# Patient Record
Sex: Male | Born: 1935 | Race: White | Hispanic: No | Marital: Married | Smoking: Former smoker
Health system: Southern US, Community
[De-identification: ages and names within clinical notes are randomized; demographics above are authoritative.]

## PROBLEM LIST (undated history)

## (undated) DIAGNOSIS — R350 Frequency of micturition: Secondary | ICD-10-CM

## (undated) DIAGNOSIS — I441 Atrioventricular block, second degree: Secondary | ICD-10-CM

## (undated) DIAGNOSIS — Z8551 Personal history of malignant neoplasm of bladder: Secondary | ICD-10-CM

## (undated) DIAGNOSIS — R300 Dysuria: Secondary | ICD-10-CM

## (undated) DIAGNOSIS — R351 Nocturia: Secondary | ICD-10-CM

## (undated) DIAGNOSIS — E785 Hyperlipidemia, unspecified: Secondary | ICD-10-CM

## (undated) HISTORY — PX: OTHER SURGICAL HISTORY: SHX169

## (undated) HISTORY — PX: LIPOMA EXCISION: SHX5283

## (undated) HISTORY — PX: CATARACT EXTRACTION: SUR2

## (undated) HISTORY — PX: TONSILLECTOMY: SUR1361

---

## 1998-05-31 ENCOUNTER — Ambulatory Visit (HOSPITAL_BASED_OUTPATIENT_CLINIC_OR_DEPARTMENT_OTHER): Admission: RE | Admit: 1998-05-31 | Discharge: 1998-05-31 | Payer: Self-pay | Admitting: Orthopedic Surgery

## 1998-08-24 ENCOUNTER — Ambulatory Visit (HOSPITAL_COMMUNITY): Admission: RE | Admit: 1998-08-24 | Discharge: 1998-08-24 | Payer: Self-pay | Admitting: Family Medicine

## 1998-10-18 ENCOUNTER — Ambulatory Visit (HOSPITAL_BASED_OUTPATIENT_CLINIC_OR_DEPARTMENT_OTHER): Admission: RE | Admit: 1998-10-18 | Discharge: 1998-10-18 | Payer: Self-pay | Admitting: Orthopedic Surgery

## 1998-12-14 ENCOUNTER — Ambulatory Visit (HOSPITAL_COMMUNITY): Admission: RE | Admit: 1998-12-14 | Discharge: 1998-12-14 | Payer: Self-pay | Admitting: Family Medicine

## 2001-10-23 ENCOUNTER — Encounter: Payer: Self-pay | Admitting: Family Medicine

## 2001-10-23 ENCOUNTER — Encounter: Admission: RE | Admit: 2001-10-23 | Discharge: 2001-10-23 | Payer: Self-pay | Admitting: Family Medicine

## 2002-11-10 ENCOUNTER — Ambulatory Visit (HOSPITAL_BASED_OUTPATIENT_CLINIC_OR_DEPARTMENT_OTHER): Admission: RE | Admit: 2002-11-10 | Discharge: 2002-11-10 | Payer: Self-pay | Admitting: Orthopedic Surgery

## 2002-11-10 ENCOUNTER — Encounter (INDEPENDENT_AMBULATORY_CARE_PROVIDER_SITE_OTHER): Payer: Self-pay

## 2002-11-10 HISTORY — PX: OTHER SURGICAL HISTORY: SHX169

## 2005-09-13 ENCOUNTER — Encounter: Admission: RE | Admit: 2005-09-13 | Discharge: 2005-09-13 | Payer: Self-pay | Admitting: Family Medicine

## 2005-10-23 ENCOUNTER — Encounter (INDEPENDENT_AMBULATORY_CARE_PROVIDER_SITE_OTHER): Payer: Self-pay | Admitting: *Deleted

## 2005-10-23 ENCOUNTER — Ambulatory Visit (HOSPITAL_BASED_OUTPATIENT_CLINIC_OR_DEPARTMENT_OTHER): Admission: RE | Admit: 2005-10-23 | Discharge: 2005-10-23 | Payer: Self-pay | Admitting: Orthopedic Surgery

## 2005-10-23 HISTORY — PX: DUPUYTREN CONTRACTURE RELEASE: SHX1478

## 2008-01-13 ENCOUNTER — Ambulatory Visit: Payer: Self-pay | Admitting: Internal Medicine

## 2008-01-27 ENCOUNTER — Encounter: Payer: Self-pay | Admitting: Internal Medicine

## 2008-01-27 ENCOUNTER — Ambulatory Visit: Payer: Self-pay | Admitting: Internal Medicine

## 2009-03-09 HISTORY — PX: TRANSTHORACIC ECHOCARDIOGRAM: SHX275

## 2009-04-19 ENCOUNTER — Ambulatory Visit: Payer: Self-pay | Admitting: Family Medicine

## 2009-12-12 ENCOUNTER — Ambulatory Visit (HOSPITAL_BASED_OUTPATIENT_CLINIC_OR_DEPARTMENT_OTHER): Admission: RE | Admit: 2009-12-12 | Discharge: 2009-12-12 | Payer: Self-pay | Admitting: Urology

## 2010-02-03 ENCOUNTER — Ambulatory Visit (HOSPITAL_BASED_OUTPATIENT_CLINIC_OR_DEPARTMENT_OTHER): Admission: RE | Admit: 2010-02-03 | Discharge: 2010-02-03 | Payer: Self-pay | Admitting: Urology

## 2010-02-03 HISTORY — PX: TRANSURETHRAL RESECTION OF BLADDER TUMOR: SHX2575

## 2010-06-05 ENCOUNTER — Ambulatory Visit (HOSPITAL_BASED_OUTPATIENT_CLINIC_OR_DEPARTMENT_OTHER): Admission: RE | Admit: 2010-06-05 | Discharge: 2010-06-05 | Payer: Self-pay | Admitting: Urology

## 2010-12-22 LAB — POCT HEMOGLOBIN-HEMACUE: Hemoglobin: 15.5 g/dL (ref 13.0–17.0)

## 2010-12-26 LAB — POCT HEMOGLOBIN-HEMACUE: Hemoglobin: 16.5 g/dL (ref 13.0–17.0)

## 2011-01-01 LAB — POCT HEMOGLOBIN-HEMACUE: Hemoglobin: 14 g/dL (ref 13.0–17.0)

## 2011-01-07 HISTORY — PX: CORRECTION HAMMER TOE: SUR317

## 2011-02-23 NOTE — Op Note (Signed)
NAMECLOIS, TREANOR              ACCOUNT NO.:  [id number]   MEDICAL RECORD NO.:  [id number]          PATIENT TYPE:  AMB   LOCATION:  DSC                          FACILITY:  [facility]   PHYSICIAN:  Katy Fitch. Sypher, M.D. DATE OF BIRTH:  09/03/1934   DATE OF PROCEDURE:  10/23/2005  DATE OF DISCHARGE:                                 OPERATIVE REPORT   PREOPERATIVE DIAGNOSIS:  Complex recurrent Dupuytren's contracture of right  small finger and palm, status post prior resection of Dupuytren's  contracture from right hand in January 2000, status post second attempt at  correction of finger contracture by needle aponeurotomy performed by Dr.  Agapito Games in [place], [place], in 2004, with a choice by Dr. Roderic Scarce to  abort the procedure due to technical challenges in air recurrent Dupuytren's  contracture and severe Dupuytren's diathesis.   POSTOPERATIVE DIAGNOSIS:  Complex recurrent Dupuytren's contracture with  identification of absent radial proper digital artery and profound palmar  fibromatosis, recurrent, extending from the level of the A1 pulley and the  palm distally to the A5 pulley, with unusual and complex nodules of palmar  fibromatosis surrounding the neurovascular bundles at the level of the A2  and A4 pulleys and a large nodule centrally overlying the A2, C1 and A3  pulleys.   OPERATION:  Extremely complex resection of recurrent Dupuytren's  contracture, right small finger.   OPERATING SURGEON:  Katy Fitch. Sypher, M.D.   ASSISTANT:  Molly Maduro Dasnoit PA-C.   ANESTHESIA:  General by LMA, supervising anesthesiologist Dr. Gelene Mink.   INDICATIONS:  Sair Faulcon is a 77 year old gentleman referred by Dr.  Bradd Canary for evaluation of Dupuytren's contracture initially in 1999.   At that time he was noted have bilateral Dupuytren's disease and ultimately  underwent a resection of his palmar fibromatosis from his left hand in  August 1999.  He subsequently had resection of  disease from his right palm,  ring finger and small finger in January 2000.  Within 3 years, he had  recurrent disease over the PIP flexion crease region and proximal phalangeal  segment of his right small finger and after a lengthy Internet search  elected to seek a consult with Dr. Christophe Louis, a hand surgeon in [place],  [place], who has extensive experience with needle aponeurotomy, i.e., a  percutaneous procedure to relieve palmar fibromatosis contractures.   Mr. Matherne traveled to see Dr. Roderic Scarce.  I prepared Mr. Hartis with a list  of written questions to ask Dr. Roderic Scarce about the risks and benefits of needle  aponeurotomy after a prior procedure with the complex scars that typically  form following prior open surgery.   Dr. Roderic Scarce attempted a percutaneous release of Mr. Neville's cords.  He  aborted his procedure after improving the flexion contracture of the PIP  joint somewhat.  I never was able to obtain a procedure note from Dr. Roderic Scarce;  however, he did call me on the phone and explained his experience with Mr.  Curnutt's contracture.   I shared with Dr. Roderic Scarce my skepticism that it would be safe or effective to  try a  needle aponeurotomy after prior open surgery.   Mr. Tardif returned in 2006 with severe recurrent disease in his right  small finger with an 85-degree PIP flexion contracture and extensive nodular  disease.   We had a lengthy informed consent, during which I explained that with his  diathesis and past experience with severe recurrent disease, an open  procedure could be attempted.  However, given the two prior procedures and  our difficulties with a redo of his left small finger in the past, he fully  understood that there were extreme risks involved in this procedure.  Indeed, I have no way of knowing on the preoperative evaluation whether or  not he has impairment of his vascular structures after his needle  aponeurotomy.   He explained that he was  contemplating a removal of his finger but was  willing to try a recurrent open procedure in an effort to try to relieve his  85-degree flexion contracture of the small finger.   He understands that he is at significant risk to develop a nerve or vascular  injury with reexploration surgery and could lose his finger.  After informed  consent, he is brought to the operating room at this time.  Preoperatively  questions were invited and answered, both in the office and in the holding  area.   PROCEDURE:  Aylan Bayona was brought to the operating room and placed in  supine position upon the table.  Following an anesthesia consult by Dr.  Gelene Mink, general anesthesia by LMA was selected.   Following induction of general anesthesia by LMA technique, the right arm  was prepped with Betadine soap and solution and sterilely draped.  A  pneumatic tourniquet was applied to the proximal brachium.   Following administration of 1 g of Ancef as an IV prophylactic antibiotic,  the arm was exsanguinated with an Esmarch bandage and the arterial  tourniquet initially inflated to 230 mmHg.   Due to some congestion of the hand after two minutes, we rewrapped a second  time and inflated the tourniquet to 230 mmHg.   Brunner zigzag incisions were planned from the level of the A5 pulley  distally to the A1 pulley proximally.  The skin incisions were taken sharply  with meticulous elevation of the dermis off of the pathologic fascia.  Mr.  Brisendine had profound fibromatosis extending the length of the incision with  a very atypical architecture.  In the past re-exploration of his left small  finger was extremely arduous and required reconstruction of the radial  proper digital artery.  I identified his all radial and ulnar proper digital  arteries at the level of the bifurcation of the common digital artery and the radial aspect of the finger and adjacent to the hypothenar muscles in  the distal palm.   These were dissected distally.  At the level of the  proximal margin of the A2 pulley, the radial proper digital artery was  absent distally.  The radial and ulnar proper digital nerves were identified  and carefully protected.   One and one-half hours of extremely arduous dissection around the proper  digital nerves was required to remove the nodular disease.  The typical  landmarks we find in Dupuytren's disease were absent given his recurrence  and probably aggravated by his prior percutaneous procedure with hematoma  formation, etc.   Ultimately we were able to remove the majority of the pathologic fascia and  relieve his PIP flexion contracture to less than 5 degrees.  After the radial proper digital artery predicament was identified, I  released the tourniquet.  There was no bleeding from arterial structures on  either the radial or ulnar side of the finger.  The ulnar proper digital  artery was identifiable and was carefully protected throughout the  procedure.  I could not find any distal portion of the radial proper digital  artery.   With the tourniquet release during the procedure, it was clear that there  was adequate collateral circulation to the ulnar proper digital artery and  perhaps some dorsal vessels to allow a viable finger.  Therefore, we  absolutely meticulously protected the ulnar proper digital artery for the  remainder of the procedure.   After excision of the pathologic fascia, I concluded that no further  procedures to this finger should ever be contemplated given the issues with  the radial proper digital artery.  There was no way to reconstruct distally.  The capillary refill in the pulp and turgor of the pulp was normal with the  flow through the ulnar proper digital artery.   After hemostasis was achieved with bipolar cautery under saline and direct  pressure, the wound was repaired with mattress sutures of 5-0 nylon.  The  skin overlying the finger  was lengthened by V-Y advancement flaps at the  apices of the wound.   The finger was then dressed with Silvadene, Adaptic, sterile gauze, sterile  Kerlix, sterile Webril and a volar plaster splint maintaining the small  finger in near-full extension of the MP joint, a 10-degree flexion position  at the PIP joint and a 10-degree flexion position at the DIP joint.   Capillary refill in the pulp was preserved during splinting.   There were no apparent complications during this procedure other than it was  extraordinarily difficult.   This was the first procedure I had ever accomplished after a prior  percutaneous fasciotomy.  My experience during this procedure will give me  great pause to ever attempt this again given the extreme changes in the  architecture of the fibromatosis and its relationship to the neurovascular  bundles.  Mr. Goldston awakened from his general anesthesia and transferred to the  recovery room with stable signs.   He will be discharged home to the care was wife with prescriptions for  Percocet 5 mg one p.o. q.4-6h. p.r.n. pain, Motrin 600 mg one p.o. q.6h. as  an analgesic, and Keflex 500 mg one p.o. q.8h. x4 days as a prophylactic  antibiotic.      Katy Fitch Sypher, M.D.  Electronically Signed     RVS/MEDQ  D:  10/23/2005  T:  10/23/2005  Job:  [id number]

## 2011-02-23 NOTE — Op Note (Signed)
James Dorsey, James Dorsey                        ACCOUNT NO.:  [id number]   MEDICAL RECORD NO.:  [id number]                   PATIENT TYPE:  AMB   LOCATION:  DSC                                  FACILITY:  [facility]   PHYSICIAN:  Katy Fitch. Naaman Plummer., M.D.          DATE OF BIRTH:  1934/03/09   DATE OF PROCEDURE:  11/10/2002  DATE OF DISCHARGE:                                 OPERATIVE REPORT   PREOPERATIVE DIAGNOSIS:  Recurrent and persistent Dupuytren's contracture  left hand with extensive involvement of thumb, index, ring finger, and  significant recurrence of PIP flexion contracture and MP nodules left small  finger.   POSTOPERATIVE DIAGNOSIS:  Recurrent and persistent Dupuytren's contracture  left hand with extensive involvement of thumb, index, ring finger, and  significant recurrence of PIP flexion contracture and MP nodules left small  finger.   PROCEDURE:  1. Resection of recurrent Dupuytren's contracture from palm and left small     finger and ring/small finger web space with multiple Z-plasties to gain     skin length and resect previous hypertrophic scar.  2. 1 cm reversed vein graft harvested from volar aspect of wrist, placed to     reconstruct to the radial proper digital artery just beyond the     bifurcation for the ring and small fingers, left hand.   SURGEON:  Katy Fitch. Sypher, M.D.   ASSISTANT:  Jonni Sanger, P.A.   ANESTHESIA:  Axillary block followed by LMA, supervising anesthesiologist is  Janetta Hora. Gelene Mink, M.D.   INDICATIONS FOR PROCEDURE:  The patient is a 77 year old man with extensive  Dupuytren's diathesis involving his hands bilaterally.  Approximately three  years ago, he underwent excision of a palmar contracture and a contracture  from the left small finger.  His initial result was excellent.  However,  approximately 18 months following surgery, he developed recurrent nodular  disease followed by the onset of a 60 degree flexion  contracture of his  small finger PIP joint.  He returned for a follow-up hand surgery  consultation.  On examination, he was noted to have pips at the MP joint of  his thumb with a significant thumb/index web space natatory ligament  contracture and PIP disease involving the index, long, and ring fingers as  well as the recurrent disease in the small finger.  He requested repeat  surgical excision.  In the office during our consultation, we had a lengthy  discussion regarding the increased risks with revision surgery,  specifically, the distinct possibility that neurovascular injury can occur  while essentially freeing neurovascular structures from dense scar.  He also  understands that there are potential risks of skin necrosis, infection, and  postoperative stiffness/regional pain syndrome.  After informed consent, he  is brought to the operating room at this time anticipating recurrent  Dupuytren's surgery.   DESCRIPTION OF PROCEDURE:  The patient was brought to the operating room and  placed in the supine position on the operating table.  Following axillary  block in the holding area, anesthesia was inconsistent in the left arm,  therefore general anesthesia by LMA was induced.   The left arm was prepped with Betadine soap and solution and sterilely  draped.  A pneumatic tourniquet was placed on the proximal brachium.  1 gram  of Ancef was administered as an IV prophylactic antibiotic followed by  exsanguination of the left arm with an Esmarch bandage and inflation of the  arterial tourniquet to 230 mmHg.   The procedure commenced with planning of a midline incision with anticipated  Z-plasties in an effort to minimize potential trauma to the neurovascular  structures.  At the initial dissection, it was obvious that this was going  to be an extremely challenging procedure.   There was dense scarring from the dermis down to the level of the flexor  sheath, the length of A1 to A3  and a large central cord extending beyond A3  toward the A4 pulley.  The flexion contracture of the PIP joint was complex  involving lateral fascial sheath nodules, a pair of spiral bands, and a  central band as well.   The wound was extended proximally to the level of the distal palmar crease  to allow identification of the neurovascular structures in [place].   Distal dissection along the radial proper digital nerve and artery was  relatively straightforward and ultimately the disease was removed from the  radial aspect of the finger without significant difficulty.   Upon following the ulnar proper digital artery and nerve from the  bifurcation with the common proper digital artery and nerve, I encountered  extremely severe scarring in the region of the former natatory ligaments to  the ring finger.  While spreading tissue, we noted a blush of blood and with  the aid of loop magnification, I was able to see a longitudinal tear along  the radial proper digital artery approximately 5 mm distal to the  bifurcation. This was marked with a microvascular clip and the dissection  continued.  Thereafter the operating microscope was used for about 1-1/2  hours of dissection.   The Dupuytren's disease involved virtually all structures between the dermis  and Cleland's ligaments dorsally.  With extraordinary care the distal  portion of the radial proper digital artery and nerve were dissected out  followed by removal of all pathologic fascia.  This corrected the PIP  flexion contracture completely.   The skin was very firm due to involvement of the skin on the palmar surface  of the finger.  Rather than grafting, I elected to perform multiple Z-  plasties with a total of four Z-plasties between the PIP flexion crease and  the middle palmar crease.   45 degree Z-plasties were planned and turned in the usual manner.  The  scarred skin made this somewhat challenging, but ultimately we  were able to increase the length of the wound and relieve the PIP and MP flexion  contractures.   The tourniquet was released and there was noted to be bleeding from the  lumen of the radial proper digital artery. There was steeling along the  ulnar aspect of the finger, but I was not satisfied with the turgor of the  finger.  Therefore, I elected to proceed with a reversed vein graft.   The tourniquet was released for 20 minutes.  Thereafter the arm was re  exsanguinated and with the aid of  the operating microscope, a 1.5 cm volar 1  mm vein graft was harvested adjacent to the flexor carpi radialis. This was  marked with a 4-0 Vicryl suture to maintain orientation.  The proximal and  distal segments of the vein were suture ligated with 4-0 Vicryl.  This wound  was then repaired with intradermal 3-0 Prolene and Steri-Strips.  Attention  was then directed to the finger.  With the aid of the operating microscope,  utilizing backwall first technique and Tsai solution, a 1 cm segment of the  radial proper digital artery was replaced.  The microsurgical procedure was  notable for some plaque within the distal segment of the radial proper  digital artery which is consistent with age 77 and peripheral  atherosclerosis.   An excellent intimal repair was achieved with 10-0 nylon proximally and  distally.  The clamps were released with immediate flow through the lumen.   The tourniquet was released followed by brisk capillary refill to all  fingers and return of turgor to all fingers including the small finger.   The primary indication for vein grafting was viability of the finger to be  certain after extensive dissection as well as prevention of cold  intolerance.   In summary, this is a very complicated procedure.  This should be coded as a  -22 procedure due to the extreme complexity of the recurrent Dupuytren's.  Given the preoperative discussion of possible neurovascular injury and the   anticipation of possible reconstruction, this is also coded as an  anticipated vein graft potentially.   The wounds were subsequently dressed with Silvadene, Adaptic, fluff gauze,  Curlex, Acrylic fluff, and a volar plaster splint maintaining the wrist in  neutral and the fingers extended at the MP joints.  There were no other  complications other than the radial proper digital artery tear.                                                Katy Fitch Naaman Plummer., M.D.    RVS/MEDQ  D:  11/10/2002  T:  11/10/2002  Job:  [id number]

## 2012-12-03 ENCOUNTER — Other Ambulatory Visit: Payer: Self-pay | Admitting: Urology

## 2012-12-03 ENCOUNTER — Encounter (HOSPITAL_BASED_OUTPATIENT_CLINIC_OR_DEPARTMENT_OTHER): Payer: Self-pay | Admitting: *Deleted

## 2012-12-03 NOTE — Progress Notes (Signed)
NPO AFTER MN. ARRIVES AT 1000. NEEDS HG AND EKG.

## 2012-12-05 NOTE — H&P (Signed)
History of Present Illness        Bladder cancer - He had an episode of gross, painless hematuria and underwent evaluation with a CT scan on 11/18/09 that revealed no abnormality of the upper tract or worrisome adenopathy in the pelvis. 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    Because there was no involvement of the muscularis on the initial resection I told him that in order to consider any bladder preservation protocols I would need to perform a repeat biopsy/re-resection of the area which was performed on 02/03/10. 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    He underwent a 6 week course of full dose BCG and then on 06/04/10 had a repeat bladder biopsy which revealed granulomatous inflammation but no evidence of dysplasia or malignancy with muscle present on the biopsy. He was then treated with maintenance BCG beginning in 12/11 with a planned duration of 36 months.        Elevated PSA:    5/10 - 2.83    9/11 - 6.33    12/11 - 4.45  His PSA subsequently fell back to within the normal range consistent with inflammation likely secondary to the effects of BCG.    Frequency/urgency/nocturia (LUTS): He has some trabeculation of the bladder seen cystoscopically and is having symptoms of detrusor instability. He has no evidence of irritative focus within the bladder so I'm going to initiate a trial of anticholinergic therapy to see if this helps. He tried VESIcare and Enablex and found that the VESIcare seemed a little bit more effective but wanted to try the VESIcare at a higher dosage.  Interval history: He has been having dysuria for about 6 weeks now. He was seen for this and a urine culture was performed and found to be  negative. He's not having any hematuria. He denies any new irritative symptoms. He's not seen any hematuria. IPSS 12/03/12 - 18   Past Medical History Problems  1. History of  Dupuytren's Contracture 728.6 2. 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 History of  Cystoscopy With Biopsy 2. History of  Cystoscopy With Biopsy 3. History of  Cystoscopy With Biopsy 4. History of  Cystoscopy With Fulguration Small Lesion (5-34mm) 5. History of  Open Palmar Fasciotomy For Dupuytren's Contracture 6. History of  Open Palmar Fasciotomy For Dupuytren's Contracture  Current Meds 1. Aspirin TABS; Therapy: (Recorded:10Feb2011) to 2. Flax Seed Oil CAPS; Therapy: (Recorded:14Aug2013) to 3. Garlic TABS; Therapy: (Recorded:06Sep2011) to 4. Oncovite Oral Tablet; Therapy: (Recorded:28Mar2012) to 5. Simvastatin TABS; Therapy: (Recorded:10Feb2011) to 6. Uribel 118 MG Oral Capsule; TAKE 1 CAPSULE Twice daily PRN; Therapy: 16Jan2014 to (Last  Rx:16Jan2014)  Allergies Medication  1. No Known Drug Allergies  Family History Problems  1. Family history of  No Significant Family History  Social History Problems  1. Alcohol Use occassional 2. Caffeine Use 2 cups of coffee a day 3. Former Smoker V15.82 quit 45 years ago. 1 ppd for 25 years 4. Marital History - Currently Married 5. Occupation: retired Denied  6. History of  Drug Use 7. History of  Tobacco Use   Review of Systems Genitourinary, constitutional, skin, eye, otolaryngeal, hematologic/lymphatic, cardiovascular, pulmonary,  endocrine, musculoskeletal, gastrointestinal, neurological and psychiatric system(s) were reviewed and pertinent findings if present are noted.  Genitourinary: urinary frequency, feelings of urinary urgency, nocturia and post-void dribbling.    Vitals Vital Signs Blood Pressure  Blood Pressure: 136 / 73 Pulse  Heart Rate: 70 Respiration  Respiration:  12  Physical Exam Constitutional: Well nourished and well developed. No acute distress.  ENT:. The ears and nose are normal in appearance.  Neck: The appearance of the neck is normal and no neck mass is present.  Pulmonary: No respiratory distress and normal respiratory rhythm and effort.  Cardiovascular: Heart rate and rhythm are normal. No peripheral edema.  Abdomen: The abdomen is soft and nontender. No masses are palpated. No CVA tenderness. No hernias are palpable. No hepatosplenomegaly noted.  Rectal: Rectal exam demonstrates normal sphincter tone, no tenderness and no masses. Estimated prostate size is 1+. The prostate has no nodularity and is not tender. The left seminal vesicle is nonpalpable. The right seminal vesicle is nonpalpable. The perineum is normal on inspection.  Genitourinary: Examination of the penis demonstrates no discharge, no masses, no lesions and a normal meatus. The penis is circumcised. The scrotum is without lesions. The right epididymis is palpably normal and non-tender. The left epididymis is palpably normal and non-tender. The right testis is non-tender and without masses. The left testis is non-tender and without masses.  Lymphatics: The femoral and inguinal nodes are not enlarged or tender.  Skin: Normal skin turgor, no visible rash and no visible skin lesions.  Neuro/Psych:. Mood and affect are appropriate.   The following images/tracing/specimen were independently visualized:  Ultrasound PVR as below.  The following clinical lab reports were reviewed:  He did have red cells noted in his urine today.  PVR: Ultrasound PVR 27 ml.    Procedure  Procedure: Cystoscopy   Indication: History of Urothelial Carcinoma.  Informed Consent: Risks, benefits, and potential adverse events were discussed and informed consent was obtained from the patient.  Prep: The patient was prepped with betadine.  Procedure Note:  Urethral meatus:. No abnormalities.  Anterior  urethra: No abnormalities.  Prostatic urethra: No abnormalities . The lateral and median prostatic lobes were enlarged. An enlarged intravesical median lobe was visualized.  Bladder: Visulization was clear. The ureteral orifices were in the normal anatomic position bilaterally and had clear efflux of urine. Examination of the bladder demonstrated grade 2 trabeculation. An old scar is noted on the posterior wall bladder. The patient tolerated the procedure well.  Complications: None.    Assessment Assessed  1. 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 Visit For: Exam Following High-risk Medication V67.51 3. Benign Prostatic Hypertrophy 600.00 4. Feelings Of Urinary Urgency 788.63      Because of the fact that he has been having persistent voiding symptoms and a history of bladder cancer I have recommended further evaluation to rule out CIS with cystoscopy and bladder biopsy. I have gone over this with him today. He has undergone this previously and understands the potential risks and complications and has elected to proceed. I therefore did not proceed with any intravesical instillation of BCG today. His urine did have red cells seen today. I will send his urine for cytology.   Plan           1. He will be scheduled for cystoscopy with bladder biopsies and barbotage. 2. Urine for cytology.

## 2012-12-08 ENCOUNTER — Encounter (HOSPITAL_BASED_OUTPATIENT_CLINIC_OR_DEPARTMENT_OTHER): Payer: Self-pay | Admitting: *Deleted

## 2012-12-08 ENCOUNTER — Encounter (HOSPITAL_BASED_OUTPATIENT_CLINIC_OR_DEPARTMENT_OTHER)
Admission: RE | Disposition: A | Discharge status: Home / Self Care | Payer: Self-pay | Source: Ambulatory Visit | Attending: Urology

## 2012-12-08 ENCOUNTER — Encounter (HOSPITAL_BASED_OUTPATIENT_CLINIC_OR_DEPARTMENT_OTHER): Payer: Self-pay | Admitting: Anesthesiology

## 2012-12-08 ENCOUNTER — Ambulatory Visit (HOSPITAL_BASED_OUTPATIENT_CLINIC_OR_DEPARTMENT_OTHER): Payer: Medicare Other | Admitting: Anesthesiology

## 2012-12-08 ENCOUNTER — Ambulatory Visit (HOSPITAL_BASED_OUTPATIENT_CLINIC_OR_DEPARTMENT_OTHER)
Admission: RE | Admit: 2012-12-08 | Discharge: 2012-12-08 | Disposition: A | Discharge status: Home / Self Care | Payer: Medicare Other | Source: Ambulatory Visit | Attending: Urology | Admitting: Urology

## 2012-12-08 DIAGNOSIS — N3289 Other specified disorders of bladder: Secondary | ICD-10-CM | POA: Insufficient documentation

## 2012-12-08 DIAGNOSIS — Z8551 Personal history of malignant neoplasm of bladder: Secondary | ICD-10-CM | POA: Insufficient documentation

## 2012-12-08 DIAGNOSIS — N3090 Cystitis, unspecified without hematuria: Secondary | ICD-10-CM | POA: Insufficient documentation

## 2012-12-08 DIAGNOSIS — N329 Bladder disorder, unspecified: Secondary | ICD-10-CM

## 2012-12-08 HISTORY — DX: Frequency of micturition: R35.0

## 2012-12-08 HISTORY — DX: Personal history of malignant neoplasm of bladder: Z85.51

## 2012-12-08 HISTORY — DX: Nocturia: R35.1

## 2012-12-08 HISTORY — DX: Dysuria: R30.0

## 2012-12-08 HISTORY — DX: Hyperlipidemia, unspecified: E78.5

## 2012-12-08 HISTORY — PX: CYSTOSCOPY WITH BIOPSY: SHX5122

## 2012-12-08 HISTORY — DX: Atrioventricular block, second degree: I44.1

## 2012-12-08 SURGERY — CYSTOSCOPY, WITH BIOPSY
Anesthesia: General | Site: Bladder | Wound class: Clean Contaminated

## 2012-12-08 MED ORDER — FENTANYL CITRATE 0.05 MG/ML IJ SOLN
25.0000 ug | INTRAMUSCULAR | Status: DC | PRN
  Filled 2012-12-08: qty 1

## 2012-12-08 MED ORDER — CIPROFLOXACIN IN D5W 200 MG/100ML IV SOLN
200.0000 mg | INTRAVENOUS | Status: AC
  Administered 2012-12-08: 200 mg via INTRAVENOUS
  Filled 2012-12-08: qty 100

## 2012-12-08 MED ORDER — KETOROLAC TROMETHAMINE 30 MG/ML IJ SOLN
15.0000 mg | Freq: Once | INTRAMUSCULAR | Status: DC | PRN
  Filled 2012-12-08: qty 1

## 2012-12-08 MED ORDER — DEXAMETHASONE SODIUM PHOSPHATE 4 MG/ML IJ SOLN
INTRAMUSCULAR | Status: DC | PRN
  Administered 2012-12-08: 10 mg via INTRAVENOUS

## 2012-12-08 MED ORDER — ONDANSETRON HCL 4 MG/2ML IJ SOLN
INTRAMUSCULAR | Status: DC | PRN
  Administered 2012-12-08: 4 mg via INTRAVENOUS

## 2012-12-08 MED ORDER — PROPOFOL 10 MG/ML IV BOLUS
INTRAVENOUS | Status: DC | PRN
  Administered 2012-12-08: 150 mg via INTRAVENOUS

## 2012-12-08 MED ORDER — LACTATED RINGERS IV SOLN
INTRAVENOUS | Status: DC
  Administered 2012-12-08 (×2): via INTRAVENOUS
  Filled 2012-12-08: qty 1000

## 2012-12-08 MED ORDER — STERILE WATER FOR IRRIGATION IR SOLN
Status: DC | PRN
  Administered 2012-12-08: 3000 mL

## 2012-12-08 MED ORDER — PROMETHAZINE HCL 25 MG/ML IJ SOLN
6.2500 mg | INTRAMUSCULAR | Status: DC | PRN
  Filled 2012-12-08: qty 1

## 2012-12-08 MED ORDER — LIDOCAINE HCL (CARDIAC) 20 MG/ML IV SOLN
INTRAVENOUS | Status: DC | PRN
  Administered 2012-12-08: 50 mg via INTRAVENOUS

## 2012-12-08 MED ORDER — FENTANYL CITRATE 0.05 MG/ML IJ SOLN
INTRAMUSCULAR | Status: DC | PRN
  Administered 2012-12-08: 25 ug via INTRAVENOUS
  Administered 2012-12-08: 50 ug via INTRAVENOUS

## 2012-12-08 MED ORDER — HYDROCODONE-ACETAMINOPHEN 10-325 MG PO TABS: 1.0000 | ORAL_TABLET | Freq: Four times a day (QID) | ORAL | Status: DC | PRN

## 2012-12-08 MED ORDER — PHENAZOPYRIDINE HCL 200 MG PO TABS: 200.0000 mg | ORAL_TABLET | Freq: Three times a day (TID) | ORAL | Status: DC | PRN

## 2012-12-08 MED ORDER — LIDOCAINE HCL 2 % EX GEL
CUTANEOUS | Status: DC | PRN
  Administered 2012-12-08: 1 via URETHRAL

## 2012-12-08 SURGICAL SUPPLY — 16 items
BAG DRAIN URO-CYSTO SKYTR STRL (DRAIN) ×2 IMPLANT
CANISTER SUCT LVC 12 LTR MEDI- (MISCELLANEOUS) ×2 IMPLANT
CLOTH BEACON ORANGE TIMEOUT ST (SAFETY) ×2 IMPLANT
DRAPE CAMERA CLOSED 9X96 (DRAPES) ×2 IMPLANT
ELECT REM PT RETURN 9FT ADLT (ELECTROSURGICAL) ×2
ELECTRODE REM PT RTRN 9FT ADLT (ELECTROSURGICAL) ×1 IMPLANT
GLOVE BIO SURGEON STRL SZ8 (GLOVE) ×2 IMPLANT
GLOVE ECLIPSE 7.0 STRL STRAW (GLOVE) ×2 IMPLANT
GLOVE INDICATOR 7.5 STRL GRN (GLOVE) ×2 IMPLANT
GOWN PREVENTION PLUS LG XLONG (DISPOSABLE) ×2 IMPLANT
GOWN STRL REIN XL XLG (GOWN DISPOSABLE) ×2 IMPLANT
IV NS IRRIG 3000ML ARTHROMATIC (IV SOLUTION) IMPLANT
NEEDLE HYPO 22GX1.5 SAFETY (NEEDLE) IMPLANT
NS IRRIG 500ML POUR BTL (IV SOLUTION) ×2 IMPLANT
PACK CYSTOSCOPY (CUSTOM PROCEDURE TRAY) ×2 IMPLANT
WATER STERILE IRR 3000ML UROMA (IV SOLUTION) ×2 IMPLANT

## 2012-12-08 NOTE — Op Note (Signed)
PATIENT:  James Dorsey  PRE-OPERATIVE DIAGNOSIS: 1. History of transitional cell carcinoma of the bladder (papillary and CIS) 2. Atypical cytology. 3. New irritative bladder symptoms.  POST-OPERATIVE DIAGNOSIS: Same  PROCEDURE:  Procedure(s): 1. Cystoscopy 2. Bladder lavage/barbotaged. 3. Cold cup bladder biopsies  SURGEON:  Surgeon(s): Garnett Farm  ANESTHESIA:   General  EBL:  Minimal  DRAINS: None  SPECIMEN:  Source of Specimen:  1. Right base laterally 2. Right posterior wall 3. Right dome 4. Left posterior wall 5. Prostatic urethra  6. Barbotaged bladder specimen  DISPOSITION OF SPECIMEN:  PATHOLOGY  Indication: Mr. [name] is a 79 year old male who was first diagnosed with transitional cell carcinoma of the bladder in 3/11. This revealed superficially invasive, high-grade TCCA with invasion into the lamina propria but not into the muscle. He then underwent repeat biopsy/re\re resection of the area and this revealed CIS and muscularis mucosal involvement but not involvement of the muscularis propria (pT2a). He elected to attempt bladder sparing and underwent 6 weeks of full-strength BCG and then repeat bladder biopsy which revealed no evidence of malignancy present. He was maintained on maintenance BCG and recently began to have some new irritative symptoms. Cystoscopically I found some slightly reddened areas in the bladder but nothing that was highly suspicious. I have however recommended a biopsy be performed and have discussed this with the patient. Understands and has elected to proceed.  Description of operation: The patient was taken to the operating room and administered general anesthesia. He was then placed on the table and moved to the dorsal lithotomy position after which his genitalia was sterilely prepped and draped. An official timeout was then performed.  The 21 French rigid cystoscope was passed under direct vision down the urethra with a 12 lens. This  revealed an entirely normal urethra. The sphincter appeared intact and the prostatic urethra revealed bilobar hypertrophy with no median lobe component. There were no prostatic urethral lesions identified. Upon entering the bladder I noted 1-2+ trabeculation. Both ureteral orifices were noted to be of normal configuration and position with clear reflux. I noted a red area on the posterior wall to the right as well as on the floor of the bladder lateral to the right ureteral orifice and somewhat posterior to this location. Is also slightly reddened area on the posterior wall the left side. The bladder was inspected with both the 12 and 70 lenses. No papillary tumors were noted.  With the cystoscope sheath in place I instilled sterile saline into the bladder and used the Toomey syringe to obtain a barbotaged specimen to be sent for cytology.  I then inserted the cold cup biopsy forceps and obtained cold cup biopsies from the above noted locations. I then used the Bugbee electrode to fulgurate these and reinspection revealed complete control of all bleeding with no evidence of perforation. I then drained the bladder, removed the cystoscope and instilled 10 cc of 2% lidocaine jelly and placed a penile clamp. The patient tolerated it well no intraoperative complications.   PLAN OF CARE: Discharge to home after PACU  PATIENT DISPOSITION:  PACU - hemodynamically stable.

## 2012-12-08 NOTE — Transfer of Care (Signed)
Immediate Anesthesia Transfer of Care Note  Patient: James Dorsey  Procedure(s) Performed: Procedure(s) (LRB): CYSTOSCOPY WITH BLADDER BIOPSY (N/A)  Patient Location: PACU  Anesthesia Type: General  Level of Consciousness: drowsy  Airway & Oxygen Therapy: Patient Spontanous Breathing and Patient connected to face mask oxygen  Post-op Assessment: Report given to PACU RN and Post -op Vital signs reviewed and stable  Post vital signs: Reviewed and stable  Complications: No apparent anesthesia complications

## 2012-12-08 NOTE — Anesthesia Procedure Notes (Signed)
Procedure Name: LMA Insertion Date/Time: 12/08/2012 11:41 AM Performed by: Norva Pavlov Pre-anesthesia Checklist: Patient identified, Emergency Drugs available, Suction available and Patient being monitored Patient Re-evaluated:Patient Re-evaluated prior to inductionOxygen Delivery Method: Circle System Utilized Preoxygenation: Pre-oxygenation with 100% oxygen Intubation Type: IV induction Ventilation: Mask ventilation without difficulty LMA: LMA inserted LMA Size: 4.0 Number of attempts: 1 Airway Equipment and Method: bite block Placement Confirmation: positive ETCO2 Tube secured with: Tape Dental Injury: Teeth and Oropharynx as per pre-operative assessment

## 2012-12-08 NOTE — Interval H&P Note (Signed)
History and Physical Interval Note:  12/08/2012 11:02 AM  James Dorsey  has presented today for surgery, with the diagnosis of hx of bladder cancer with voiding symptoms  The various methods of treatment have been discussed with the patient and family. After consideration of risks, benefits and other options for treatment, the patient has consented to  Procedure(s): CYSTOSCOPY WITH BLADDER BIOPSY (N/A) as a surgical intervention .  The patient's history has been reviewed, patient examined, no change in status, stable for surgery.  I have reviewed the patient's chart and labs.  Questions were answered to the patient's satisfaction.     Garnett Farm

## 2012-12-08 NOTE — Anesthesia Postprocedure Evaluation (Signed)
  Anesthesia Post-op Note  Patient: James Dorsey  Procedure(s) Performed: Procedure(s) (LRB): CYSTOSCOPY WITH BLADDER BIOPSY (N/A)  Patient Location: PACU  Anesthesia Type: General  Level of Consciousness: awake and alert   Airway and Oxygen Therapy: Patient Spontanous Breathing  Post-op Pain: mild  Post-op Assessment: Post-op Vital signs reviewed, Patient's Cardiovascular Status Stable, Respiratory Function Stable, Patent Airway and No signs of Nausea or vomiting  Last Vitals:  Filed Vitals:   12/08/12 1205  BP:   Pulse: 63  Temp:   Resp: 13    Post-op Vital Signs: stable   Complications: No apparent anesthesia complications

## 2012-12-08 NOTE — Anesthesia Preprocedure Evaluation (Signed)
Anesthesia Evaluation  Patient identified by MRN, date of birth, ID band Patient awake    Reviewed: Allergy & Precautions, H&P , NPO status , Patient's Chart, lab work & pertinent test results  Airway Mallampati: II TM Distance: >3 FB Neck ROM: Full    Dental no notable dental hx.    Pulmonary neg pulmonary ROS,  breath sounds clear to auscultation  Pulmonary exam normal       Cardiovascular + dysrhythmias Rhythm:Regular Rate:Normal  Mobitz type I   Neuro/Psych negative neurological ROS  negative psych ROS   GI/Hepatic negative GI ROS, Neg liver ROS,   Endo/Other  negative endocrine ROS  Renal/GU negative Renal ROS  negative genitourinary   Musculoskeletal negative musculoskeletal ROS (+)   Abdominal   Peds negative pediatric ROS (+)  Hematology negative hematology ROS (+)   Anesthesia Other Findings   Reproductive/Obstetrics negative OB ROS                           Anesthesia Physical Anesthesia Plan  ASA: II  Anesthesia Plan: General   Post-op Pain Management:    Induction: Intravenous  Airway Management Planned: LMA  Additional Equipment:   Intra-op Plan:   Post-operative Plan:   Informed Consent: I have reviewed the patients History and Physical, chart, labs and discussed the procedure including the risks, benefits and alternatives for the proposed anesthesia with the patient or authorized representative who has indicated his/her understanding and acceptance.   Dental advisory given  Plan Discussed with: CRNA and Surgeon  Anesthesia Plan Comments:         Anesthesia Quick Evaluation

## 2012-12-09 ENCOUNTER — Encounter (HOSPITAL_BASED_OUTPATIENT_CLINIC_OR_DEPARTMENT_OTHER): Payer: Self-pay | Admitting: Urology

## 2013-02-19 ENCOUNTER — Encounter: Payer: Self-pay | Admitting: Internal Medicine

## 2013-03-27 ENCOUNTER — Encounter (HOSPITAL_BASED_OUTPATIENT_CLINIC_OR_DEPARTMENT_OTHER): Payer: Self-pay

## 2013-03-27 ENCOUNTER — Emergency Department (HOSPITAL_BASED_OUTPATIENT_CLINIC_OR_DEPARTMENT_OTHER)
Admission: EM | Admit: 2013-03-27 | Discharge: 2013-03-27 | Disposition: A | Discharge status: Home / Self Care | Payer: Medicare Other | Attending: Emergency Medicine | Admitting: Emergency Medicine

## 2013-03-27 ENCOUNTER — Emergency Department (HOSPITAL_BASED_OUTPATIENT_CLINIC_OR_DEPARTMENT_OTHER): Payer: Medicare Other

## 2013-03-27 DIAGNOSIS — T63001A Toxic effect of unspecified snake venom, accidental (unintentional), initial encounter: Secondary | ICD-10-CM | POA: Insufficient documentation

## 2013-03-27 DIAGNOSIS — Z8679 Personal history of other diseases of the circulatory system: Secondary | ICD-10-CM | POA: Insufficient documentation

## 2013-03-27 DIAGNOSIS — Y92410 Unspecified street and highway as the place of occurrence of the external cause: Secondary | ICD-10-CM | POA: Insufficient documentation

## 2013-03-27 DIAGNOSIS — Z8551 Personal history of malignant neoplasm of bladder: Secondary | ICD-10-CM | POA: Insufficient documentation

## 2013-03-27 DIAGNOSIS — Z23 Encounter for immunization: Secondary | ICD-10-CM | POA: Insufficient documentation

## 2013-03-27 DIAGNOSIS — Y9389 Activity, other specified: Secondary | ICD-10-CM | POA: Insufficient documentation

## 2013-03-27 DIAGNOSIS — Z87891 Personal history of nicotine dependence: Secondary | ICD-10-CM | POA: Insufficient documentation

## 2013-03-27 DIAGNOSIS — S61209A Unspecified open wound of unspecified finger without damage to nail, initial encounter: Secondary | ICD-10-CM | POA: Insufficient documentation

## 2013-03-27 DIAGNOSIS — Z7982 Long term (current) use of aspirin: Secondary | ICD-10-CM | POA: Insufficient documentation

## 2013-03-27 DIAGNOSIS — Z79899 Other long term (current) drug therapy: Secondary | ICD-10-CM | POA: Insufficient documentation

## 2013-03-27 DIAGNOSIS — E785 Hyperlipidemia, unspecified: Secondary | ICD-10-CM | POA: Insufficient documentation

## 2013-03-27 DIAGNOSIS — W5911XA Bitten by nonvenomous snake, initial encounter: Secondary | ICD-10-CM

## 2013-03-27 LAB — COMPREHENSIVE METABOLIC PANEL
ALT: 6 U/L (ref 0–53)
AST: 28 U/L (ref 0–37)
Alkaline Phosphatase: 70 U/L (ref 39–117)
BUN: 17 mg/dL (ref 6–23)
CO2: 26 mEq/L (ref 19–32)
Calcium: 9.2 mg/dL (ref 8.4–10.5)
Chloride: 102 mEq/L (ref 96–112)
GFR calc Af Amer: 82 mL/min — ABNORMAL LOW (ref >90)
GFR calc non Af Amer: 70 mL/min — ABNORMAL LOW (ref >90)
Glucose, Bld: 116 mg/dL — ABNORMAL HIGH (ref 70–99)
Sodium: 138 mEq/L (ref 135–145)
Sodium: 139 mEq/L (ref 135–145)
Total Bilirubin: 0.5 mg/dL (ref 0.3–1.2)
Total Protein: 6.6 g/dL (ref 6.0–8.3)

## 2013-03-27 LAB — CBC WITH DIFFERENTIAL/PLATELET
Basophils Absolute: 0 10*3/uL (ref 0.0–0.1)
Basophils Absolute: 0 10*3/uL (ref 0.0–0.1)
HCT: 45.7 % (ref 39.0–52.0)
HCT: 44.9 % (ref 39.0–52.0)
Hemoglobin: 15.6 g/dL (ref 13.0–17.0)
Lymphocytes Relative: 36 % (ref 12–46)
Lymphocytes Relative: 10 % — ABNORMAL LOW (ref 12–46)
Lymphs Abs: 2.4 10*3/uL (ref 0.7–4.0)
Monocytes Absolute: 0.7 10*3/uL (ref 0.1–1.0)
Neutro Abs: 3.5 10*3/uL (ref 1.7–7.7)
Neutro Abs: 10.7 10*3/uL — ABNORMAL HIGH (ref 1.7–7.7)
Platelets: 183 10*3/uL (ref 150–400)
RBC: 5.06 MIL/uL (ref 4.22–5.81)
RDW: 13.2 % (ref 11.5–15.5)
RDW: 12.9 % (ref 11.5–15.5)
WBC: 6.7 10*3/uL (ref 4.0–10.5)
WBC: 12.7 10*3/uL — ABNORMAL HIGH (ref 4.0–10.5)

## 2013-03-27 LAB — PROTIME-INR
INR: 1.03 (ref 0.00–1.49)
Prothrombin Time: 13.4 seconds (ref 11.6–15.2)

## 2013-03-27 LAB — URINALYSIS, ROUTINE W REFLEX MICROSCOPIC
Glucose, UA: NEGATIVE mg/dL
Leukocytes, UA: NEGATIVE
Protein, ur: NEGATIVE mg/dL
Specific Gravity, Urine: 1.017 (ref 1.005–1.030)
pH: 6 (ref 5.0–8.0)

## 2013-03-27 LAB — APTT: aPTT: 35 seconds (ref 24–37)

## 2013-03-27 LAB — FIBRINOGEN: Fibrinogen: 263 mg/dL (ref 204–475)

## 2013-03-27 MED ORDER — FENTANYL CITRATE 0.05 MG/ML IJ SOLN
50.0000 ug | Freq: Once | INTRAMUSCULAR | Status: AC
  Administered 2013-03-27: 50 ug via INTRAVENOUS
  Filled 2013-03-27: qty 2

## 2013-03-27 MED ORDER — CROTALIDAE POLYVAL IMMUNE FAB IV SOLR
4.0000 | Freq: Once | INTRAVENOUS | Status: AC
  Administered 2013-03-27: 40 mL via INTRAVENOUS
  Filled 2013-03-27: qty 30

## 2013-03-27 MED ORDER — TETANUS-DIPHTH-ACELL PERTUSSIS 5-2.5-18.5 LF-MCG/0.5 IM SUSP
0.5000 mL | Freq: Once | INTRAMUSCULAR | Status: AC
  Administered 2013-03-27: 0.5 mL via INTRAMUSCULAR
  Filled 2013-03-27: qty 0.5

## 2013-03-27 MED ORDER — HYDROCODONE-ACETAMINOPHEN 5-325 MG PO TABS: 2.0000 | ORAL_TABLET | Freq: Four times a day (QID) | ORAL | Status: DC | PRN

## 2013-03-27 NOTE — ED Notes (Signed)
CroFab Gtt titrated to per protocol

## 2013-03-27 NOTE — ED Notes (Signed)
MD at bedside. 

## 2013-03-27 NOTE — ED Notes (Signed)
Thumb 9 cm circumference Wrist 19 cm circumference Forearm 27.5 cm circumference Bicep 30 cm circumference Dressing to thumb changed and 4x4 reapplied- elevated on pillow Pt declines need for pain medication.

## 2013-03-27 NOTE — ED Notes (Signed)
Urinal provided.

## 2013-03-27 NOTE — ED Notes (Signed)
Snake bite to right thumb- Swelling and bruising to thumb and hand,

## 2013-03-27 NOTE — ED Notes (Signed)
Redness noted on forearm Thumb 9 cm Hand 24 cm Wrist 19 cm Forearm 27 cm Bicep 28.5 cm

## 2013-03-27 NOTE — ED Notes (Signed)
Thumb 9 cm Hand 24.5 cm Wrist 19 cm Forearm 277 cm Bicep 28.5 cm

## 2013-03-27 NOTE — ED Notes (Signed)
Poison Control called, update given, will monitor.

## 2013-03-27 NOTE — ED Notes (Addendum)
Blister noted to right thumb with weeping fluid Thumb 9cm Hand 24.5cm Wrist 19cm Forearm 27cm Bicep 28.5cm

## 2013-03-27 NOTE — ED Notes (Addendum)
Site marked with surgical marker at margins and measurements performed. 24 cm at hand, 19 cm at wrist, 27.5 cm at forearm, 31 cm at bicep.  Arm elevated on 2 pillows  Poison Control notified via Dr. Bernette Mayers.

## 2013-03-27 NOTE — ED Notes (Signed)
CroFab Gtt titrated to 450 ml/hr to complete infusion.  Pt stable, VSS.

## 2013-03-27 NOTE — ED Notes (Signed)
CroFab infusion complete

## 2013-03-27 NOTE — ED Notes (Addendum)
MD at bedside. D/C paperwork received from poison control

## 2013-03-27 NOTE — ED Notes (Signed)
Patient transported to X-ray 

## 2013-03-27 NOTE — ED Notes (Addendum)
CroFab gtt titrated to 84ml/hr per protocol

## 2013-03-27 NOTE — ED Notes (Signed)
CroFab gtt titrated to 41ml/hr per protocol

## 2013-03-27 NOTE — ED Notes (Signed)
Poison Control updated on pt status.

## 2013-03-27 NOTE — ED Provider Notes (Signed)
History     CSN: [id number]  Arrival date & time 03/27/13  4098   First MD Initiated Contact with Patient 03/27/13 [phone]      Chief Complaint  Patient presents with  . Snake Bite    (Consider location/radiation/quality/duration/timing/severity/associated sxs/prior treatment) HPI Pt with no significant PMH is right handed. Reports he picked up a snake that was in the street in front of his house about an hour prior to arrival and was bit on the R thumb. He took a picture of the snake which shows apparent copperhead. He reports it was about 12-14 inches long. He reports moderate pain and swelling to L thumb. No CP, SOB, dizziness, nausea or vomiting.   Past Medical History  Diagnosis Date  . History of bladder cancer   . Hyperlipidemia   . Frequency of urination   . Nocturia   . Dysuria   . Heart block AV second degree     MOBITZ ONE    Past Surgical History  Procedure Laterality Date  . Cysto/   bladder bx's  06-05-2010  &  12-12-2009  . Transurethral resection of bladder tumor  02-03-2010    AND BX'S  . Dupuytren contracture release Right 10-23-2005    COMPLEX RESECTION RECURRENT CONTRACTURE RIGHT SMALL FINGER  . Resection recurrent dupuytren contracture left hand  11-10-2002  . Lipoma excision      LEFT SHOULDER AND NECK  . Tonsillectomy    . Transthoracic echocardiogram  03-09-2009    NORMAL LV/ EF 50-55%/ PAC'S NOTED  . Correction hammer toe  APRIL 2012    RIGHT SECOND TOE  . Cystoscopy with biopsy N/A 12/08/2012    Procedure: CYSTOSCOPY WITH BLADDER BIOPSY;  Surgeon: Garnett Farm, MD;  Location: [facility];  Service: Urology;  Laterality: N/A;    No family history on file.  History  Substance Use Topics  . Smoking status: Former Smoker    Quit date: 12/03/1969  . Smokeless tobacco: Never Used  . Alcohol Use: 1.8 oz/week    3 Glasses of wine per week      Review of Systems All other systems reviewed and are negative except as noted in  HPI.   Allergies  Review of patient's allergies indicates no known allergies.  Home Medications   Current Outpatient Rx  Name  Route  Sig  Dispense  Refill  . aspirin EC 81 MG tablet   Oral   Take 81 mg by mouth daily.         . CENTRUM HERBALS GARLIC PO   Oral   Take by mouth daily.         . Flaxseed, Linseed, (FLAX SEED OIL PO)   Oral   Take by mouth.         . Multiple Vitamins-Minerals (ONCOVITE PO)   Oral   Take by mouth daily.         . simvastatin (ZOCOR) 40 MG tablet   Oral   Take 40 mg by mouth every evening.           BP 174/89  Pulse 77  Temp(Src) 97.7 F (36.5 C) (Oral)  Resp 16  Ht 5\' 7"  (1.702 m)  Wt 172 lb (78.019 kg)  BMI 26.93 kg/m2  SpO2 97%  Physical Exam  Nursing note and vitals reviewed. Constitutional: He is oriented to person, place, and time. He appears well-developed and well-nourished.  HENT:  Head: Normocephalic and atraumatic.  Eyes: EOM are normal. Pupils are  equal, round, and reactive to light.  Neck: Normal range of motion. Neck supple.  Cardiovascular: Normal rate, normal heart sounds and intact distal pulses.   Pulmonary/Chest: Effort normal and breath sounds normal.  Abdominal: Bowel sounds are normal. He exhibits no distension. There is no tenderness.  Musculoskeletal: Normal range of motion. He exhibits edema and tenderness.  Two puncture wounds to L thumb with moderate swelling, ecchymosis extending to the mid-hand. No proximal swelling, tenderness or evidence of compartment syndrome  Neurological: He is alert and oriented to person, place, and time. He has normal strength. No cranial nerve deficit or sensory deficit.  Skin: Skin is warm and dry. No rash noted.  Psychiatric: He has a normal mood and affect.    ED Course  Procedures (including critical care time)  Labs Reviewed  COMPREHENSIVE METABOLIC PANEL - Abnormal; Notable for the following:    Glucose, Bld 139 (*)    GFR calc non Af Amer 70 (*)     GFR calc Af Amer 82 (*)    All other components within normal limits  CBC WITH DIFFERENTIAL - Abnormal; Notable for the following:    WBC 12.7 (*)    Neutrophils Relative % 84 (*)    Neutro Abs 10.7 (*)    Lymphocytes Relative 10 (*)    All other components within normal limits  CBC WITH DIFFERENTIAL  PROTIME-INR  APTT  FIBRINOGEN  D-DIMER, QUANTITATIVE  URINALYSIS, ROUTINE W REFLEX MICROSCOPIC  PROTIME-INR  APTT  COMPREHENSIVE METABOLIC PANEL  FIBRINOGEN   Dg Finger Thumb Right  03/27/2013   *RADIOLOGY REPORT*  Clinical Data: Snake bite.  RIGHT THUMB 2+V  Comparison: None.  Findings: Degenerative spurring at the right thumb IP joint. No acute bony abnormality.  Specifically, no fracture, subluxation, or dislocation.  Soft tissues are intact.  No radiopaque foreign bodies.  IMPRESSION: No acute bony abnormality or radiopaque foreign body.   Original Report Authenticated By: Charlett Nose, M.D.     1. Snake bite, initial encounter       MDM   Date: 03/27/2013  Rate: 66  Rhythm: normal sinus rhythm, PVCs  QRS Axis: normal  Intervals: 1st degree AV block  ST/T Wave abnormalities: normal  Conduction Disutrbances: none  Narrative Interpretation: unremarkable  Spoke with Poison Control who recommends elevation, observation, frequent measurements for swelling and labs at 6hrs post-bite. Will check labs now for baseline. CroFab not indicated at this time, but will consider administration if swelling or symptoms worsen. Xray ordered to eval retained fang.   2:56 PM Pt had worsening swelling over the initial few hours he was in the ED. Given 4vials of CroFab after consultation with Poison Control. He has subsequently had improved pain and swelling. Recheck labs at 6 hrs post-bite in progress now, but anticipate discharge if no significant change.   3:40 PM Fibrinogen still pending but otherwise no change in labs. Care signed out to Dr. Ranae Palms pending final lab result.   CRITICAL  CARE Performed by: Pollyann Savoy Total critical care time: 50 Critical care time was exclusive of separately billable procedures and treating other patients. Critical care was necessary to treat or prevent imminent or life-threatening deterioration. Critical care was time spent personally by me on the following activities: development of treatment plan with patient and/or surrogate as well as nursing, discussions with consultants, evaluation of patient's response to treatment, examination of patient, obtaining history from patient or surrogate, ordering and performing treatments and interventions, ordering and review of laboratory studies, ordering  and review of radiographic studies, pulse oximetry and re-evaluation of patient's condition.      Charles B. Bernette Mayers, MD 03/27/13 1540

## 2013-03-27 NOTE — ED Notes (Signed)
Swelling noted past margin, extending to distal aspect of forearm- area marked

## 2013-05-13 ENCOUNTER — Other Ambulatory Visit: Payer: Self-pay

## 2013-08-13 ENCOUNTER — Other Ambulatory Visit: Payer: Self-pay

## 2014-03-17 ENCOUNTER — Encounter: Payer: Self-pay | Admitting: Internal Medicine

## 2014-09-22 ENCOUNTER — Encounter: Payer: Self-pay | Admitting: Internal Medicine

## 2014-10-20 ENCOUNTER — Ambulatory Visit: Payer: Commercial Managed Care - HMO

## 2014-10-20 VITALS — Ht 67.5 in | Wt 170.0 lb

## 2014-10-20 DIAGNOSIS — Z860100 Personal history of colon polyps, unspecified: Secondary | ICD-10-CM

## 2014-10-20 MED ORDER — MOVIPREP 100 G PO SOLR: 1.0000 | Freq: Once | ORAL | Status: DC

## 2014-10-20 NOTE — Progress Notes (Signed)
No allergies to eggs or soy No home oxygen No past problems with anesthesia No diet/weight loss meds  Has email.  Emmi instructions given for colonoscopy. 

## 2014-10-25 ENCOUNTER — Telehealth: Payer: Self-pay | Admitting: Internal Medicine

## 2014-10-25 NOTE — Telephone Encounter (Signed)
He states the price is too for his prep and I told him he could have a free sample.  Sample left at the front desk on 4th floor for him to pick up

## 2014-11-03 DIAGNOSIS — L821 Other seborrheic keratosis: Secondary | ICD-10-CM | POA: Diagnosis not present

## 2014-11-04 ENCOUNTER — Encounter: Payer: Self-pay | Admitting: Internal Medicine

## 2014-11-10 ENCOUNTER — Encounter: Payer: Medicare Other | Admitting: Internal Medicine

## 2014-11-16 ENCOUNTER — Encounter: Payer: Self-pay | Admitting: Internal Medicine

## 2014-11-16 ENCOUNTER — Ambulatory Visit (AMBULATORY_SURGERY_CENTER): Payer: Commercial Managed Care - HMO | Admitting: Internal Medicine

## 2014-11-16 VITALS — BP 137/73 | HR 53 | Temp 97.6°F | Resp 20 | Ht 67.5 in | Wt 170.0 lb

## 2014-11-16 DIAGNOSIS — Z86010 Personal history of colonic polyps: Secondary | ICD-10-CM

## 2014-11-16 DIAGNOSIS — Z1211 Encounter for screening for malignant neoplasm of colon: Secondary | ICD-10-CM | POA: Diagnosis not present

## 2014-11-16 MED ORDER — SODIUM CHLORIDE 0.9 % IV SOLN: 500.0000 mL | INTRAVENOUS | Status: DC

## 2014-11-16 NOTE — Patient Instructions (Signed)
YOU HAD AN ENDOSCOPIC PROCEDURE TODAY AT THE [facility]: Refer to the procedure report that was given to you for any specific questions about what was found during the examination.  If the procedure report does not answer your questions, please call your gastroenterologist to clarify.  If you requested that your care partner not be given the details of your procedure findings, then the procedure report has been included in a sealed envelope for you to review at your convenience later.  YOU SHOULD EXPECT: Some feelings of bloating in the abdomen. Passage of more gas than usual.  Walking can help get rid of the air that was put into your GI tract during the procedure and reduce the bloating. If you had a lower endoscopy (such as a colonoscopy or flexible sigmoidoscopy) you may notice spotting of blood in your stool or on the toilet paper. If you underwent a bowel prep for your procedure, then you may not have a normal bowel movement for a few days.  DIET: Your first meal following the procedure should be a light meal and then it is ok to progress to your normal diet.  A half-sandwich or bowl of soup is an example of a good first meal.  Heavy or fried foods are harder to digest and may make you feel nauseous or bloated.  Likewise meals heavy in dairy and vegetables can cause extra gas to form and this can also increase the bloating.  Drink plenty of fluids but you should avoid alcoholic beverages for 24 hours.  ACTIVITY: Your care partner should take you home directly after the procedure.  You should plan to take it easy, moving slowly for the rest of the day.  You can resume normal activity the day after the procedure however you should NOT DRIVE or use heavy machinery for 24 hours (because of the sedation medicines used during the test).    SYMPTOMS TO REPORT IMMEDIATELY: A gastroenterologist can be reached at any hour.  During normal business hours, 8:30 AM to 5:00 PM Monday through Friday,  call [phone].  After hours and on weekends, please call the GI answering service at [phone] who will take a message and have the physician on call contact you.   Following lower endoscopy (colonoscopy or flexible sigmoidoscopy):  Excessive amounts of blood in the stool  Significant tenderness or worsening of abdominal pains  Swelling of the abdomen that is new, acute  Fever of 100F or higher    FOLLOW UP: If any biopsies were taken you will be contacted by phone or by letter within the next 1-3 weeks.  Call your gastroenterologist if you have not heard about the biopsies in 3 weeks.  Our staff will call the home number listed on your records the next business day following your procedure to check on you and address any questions or concerns that you may have at that time regarding the information given to you following your procedure. This is a courtesy call and so if there is no answer at the home number and we have not heard from you through the emergency physician on call, we will assume that you have returned to your regular daily activities without incident.  SIGNATURES/CONFIDENTIALITY: You and/or your care partner have signed paperwork which will be entered into your electronic medical record.  These signatures attest to the fact that that the information above on your After Visit Summary has been reviewed and is understood.  Full responsibility of the confidentiality   of this discharge information lies with you and/or your care-partner.   Information on diverticulosis given to you today   

## 2014-11-16 NOTE — Progress Notes (Signed)
Patient awakening,vss,report to rn 

## 2014-11-16 NOTE — Op Note (Signed)
[facility]  [address]. [place], [address]   COLONOSCOPY PROCEDURE REPORT  PATIENT: James, Dorsey  MR#: [id number] BIRTHDATE: [birth year] , 80  yrs. old GENDER: male ENDOSCOPIST: Eustace Quail, MD REFERRED [id number] Program Recall PROCEDURE DATE:  11/16/2014 PROCEDURE:   Colonoscopy, surveillance First Screening Colonoscopy - Avg.  risk and is 50 yrs.  old or older - No.  Prior Negative Screening - Now for repeat screening. N/A  History of Adenoma - Now for follow-up colonoscopy & has been > or = to 3 yrs.  Yes hx of adenoma.  Has been 3 or more years since last colonoscopy.  Polyps Removed Today? No.  Recommend repeat exam, <10 yrs? No. ASA CLASS:   Class II INDICATIONS:follow up of adenomatous colonic polyp(s). . Index exam April 2009 with diminutive adenoma MEDICATIONS: Monitored anesthesia care and Propofol 200 mg IV  DESCRIPTION OF PROCEDURE:   After the risks benefits and alternatives of the procedure were thoroughly explained, informed consent was obtained.  The digital rectal exam revealed no abnormalities of the rectum.   The [id number]  endoscope was introduced through the anus and advanced to the cecum, which was identified by both the appendix and ileocecal valve. No adverse events experienced.   The quality of the prep was excellent, using MoviPrep  The instrument was then slowly withdrawn as the colon was fully examined.     COLON FINDINGS: There was moderate diverticulosis noted in the sigmoid colon.   The examination was otherwise normal.  Retroflexed views revealed internal hemorrhoids. The time to cecum=3 minutes 21 seconds.  Withdrawal time=9 minutes 26 seconds.  The scope was withdrawn and the procedure completed. COMPLICATIONS: There were no immediate complications.  ENDOSCOPIC IMPRESSION: 1.   Moderate diverticulosis was noted in the sigmoid colon 2.   The examination was otherwise  normal  RECOMMENDATIONS: 1. Return to the care of your primary provider.  GI follow up as needed  eSigned:  Eustace Quail, MD 11/16/2014 10:55 AM   cc: The Patient and Orpah Melter, MD

## 2014-11-17 ENCOUNTER — Telehealth: Payer: Self-pay

## 2014-11-17 NOTE — Telephone Encounter (Signed)
  Follow up Call-  Call back number 11/16/2014  Post procedure Call Back phone  # [phone]  Permission to leave phone message Yes     Patient questions:  Do you have a fever, pain , or abdominal swelling? No. Pain Score  0 *  Have you tolerated food without any problems? Yes.    Have you been able to return to your normal activities? Yes.    Do you have any questions about your discharge instructions: Diet   No. Medications  No. Follow up visit  No.  Do you have questions or concerns about your Care? No.  Actions: * If pain score is 4 or above: No action needed, pain <4.

## 2014-12-09 DIAGNOSIS — M542 Cervicalgia: Secondary | ICD-10-CM | POA: Diagnosis not present

## 2014-12-23 DIAGNOSIS — L03019 Cellulitis of unspecified finger: Secondary | ICD-10-CM | POA: Diagnosis not present

## 2015-01-06 ENCOUNTER — Ambulatory Visit (INDEPENDENT_AMBULATORY_CARE_PROVIDER_SITE_OTHER): Payer: Commercial Managed Care - HMO | Admitting: Podiatry

## 2015-01-06 ENCOUNTER — Encounter: Payer: Self-pay | Admitting: Podiatry

## 2015-01-06 VITALS — BP 113/67 | HR 77 | Resp 15

## 2015-01-06 DIAGNOSIS — L600 Ingrowing nail: Secondary | ICD-10-CM

## 2015-01-06 NOTE — Progress Notes (Signed)
Subjective:     Patient ID: James Dorsey, male   DOB: 22-Nov-1933, 81 y.o.   MRN: [id number]  HPI patient presents stating I have a painful ingrown toenail on my left hallux medial border that I cannot trim or take care of myself and it's sore and it had hammertoe surgery I wanted checked on my right foot   Review of Systems  All other systems reviewed and are negative.      Objective:   Physical Exam  Constitutional: He is oriented to person, place, and time.  Cardiovascular: Intact distal pulses.   Musculoskeletal: Normal range of motion.  Neurological: He is oriented to person, place, and time.  Skin: Skin is warm.  Nursing note and vitals reviewed.  neurovascular status intact with muscle strength adequate range of motion subtalar midtarsal joint within normal limits. Patient's noted to have incurvated left hallux medial border that's painful with pressed with distal redness but no drainage. Patient's noted on the right second toe to have had previous surgery done with corn that has reduced but is noted to have quite a short toe secondary to the procedure     Assessment:     Ingrown toenail deformity left hallux medial border that's painful and hammertoe deformity second right that's improved with surgery    Plan:     Reviewed both conditions and discussed. At this point I recommended correction of ingrown toenail and explained risk and patient wants procedure. Infiltrated 60 mg I can Marcaine mixture remove the medial corner exposed matrix and applied phenol 3 applications 30 seconds followed by alcohol lavage and sterile dressing. Advised on wider shoes for the right one

## 2015-01-06 NOTE — Patient Instructions (Signed)

## 2015-01-06 NOTE — Progress Notes (Signed)
   Subjective:    Patient ID: James Dorsey, male    DOB: 12/17/1933, 81 y.o.   MRN: [id number]  HPI  Pt presents with left great toe nail pain, possible ingrown, was clipped in the past and has improved, but seems to be a recurring problem. He also states that he has a h/o of hammertoe repair to his right foot but denies any issues or pain with it at this time  Review of Systems  All other systems reviewed and are negative.      Objective:   Physical Exam        Assessment & Plan:

## 2015-01-13 ENCOUNTER — Telehealth: Payer: Self-pay | Admitting: *Deleted

## 2015-01-13 NOTE — Telephone Encounter (Signed)
"  Need clarification I'm supposed to follow for after ingrown toenail procedure.  Need instructions."  I returned his call.  "I had procedure done about a week ago for ingrown toenail.  I received instructions for anti-bacterial soap.  The instructions didn't say how often to do it or how long."  You can do it twice a day and continue to do the soaks as long as there is drainage.  "There's not any more drainage.  He did a great job cutting it [employer], you can stop the soaks.  "Alright, that's what I wanted to hear."

## 2015-03-02 DIAGNOSIS — M5032 Other cervical disc degeneration, mid-cervical region: Secondary | ICD-10-CM | POA: Diagnosis not present

## 2015-03-02 DIAGNOSIS — M5033 Other cervical disc degeneration, cervicothoracic region: Secondary | ICD-10-CM | POA: Diagnosis not present

## 2015-03-14 DIAGNOSIS — T63481A Toxic effect of venom of other arthropod, accidental (unintentional), initial encounter: Secondary | ICD-10-CM | POA: Diagnosis not present

## 2015-05-26 DIAGNOSIS — M791 Myalgia: Secondary | ICD-10-CM | POA: Diagnosis not present

## 2015-06-29 DIAGNOSIS — D090 Carcinoma in situ of bladder: Secondary | ICD-10-CM | POA: Diagnosis not present

## 2015-06-29 DIAGNOSIS — R828 Abnormal findings on cytological and histological examination of urine: Secondary | ICD-10-CM | POA: Diagnosis not present

## 2015-07-04 DIAGNOSIS — E780 Pure hypercholesterolemia: Secondary | ICD-10-CM | POA: Diagnosis not present

## 2015-07-04 DIAGNOSIS — M542 Cervicalgia: Secondary | ICD-10-CM | POA: Diagnosis not present

## 2015-07-04 DIAGNOSIS — Z23 Encounter for immunization: Secondary | ICD-10-CM | POA: Diagnosis not present

## 2015-07-04 DIAGNOSIS — Z131 Encounter for screening for diabetes mellitus: Secondary | ICD-10-CM | POA: Diagnosis not present

## 2015-07-12 DIAGNOSIS — E7800 Pure hypercholesterolemia, unspecified: Secondary | ICD-10-CM | POA: Diagnosis not present

## 2015-07-12 DIAGNOSIS — Z131 Encounter for screening for diabetes mellitus: Secondary | ICD-10-CM | POA: Diagnosis not present

## 2015-07-27 ENCOUNTER — Ambulatory Visit (INDEPENDENT_AMBULATORY_CARE_PROVIDER_SITE_OTHER): Payer: Commercial Managed Care - HMO | Admitting: Podiatry

## 2015-07-27 ENCOUNTER — Encounter: Payer: Self-pay | Admitting: Podiatry

## 2015-07-27 VITALS — BP 115/68 | HR 67 | Resp 16

## 2015-07-27 DIAGNOSIS — L03031 Cellulitis of right toe: Secondary | ICD-10-CM

## 2015-07-27 DIAGNOSIS — L03011 Cellulitis of right finger: Secondary | ICD-10-CM

## 2015-07-27 NOTE — Patient Instructions (Signed)

## 2015-07-27 NOTE — Progress Notes (Signed)
Subjective:     Patient ID: James Dorsey, male   DOB: 1934-10-05, 81 y.o.   MRN: [id number]  HPI patient presents with left big toe hurting him stating that just in the last month it's become red irritated and I do not remember specific trauma but it is bothering me somewhat from the beginning   Review of Systems     Objective:   Physical Exam Neurovascular status intact muscle strength adequate range of motion within normal limits with patient noted to have yellow discharge from the medial side of the left hallux and incurvation of the corner of a mild to moderate nature    Assessment:     Paronychia infection of the left hallux medial border localized in nature and ingrown toenail    Plan:     Reviewed condition and recommended removal of the corner cleaning up of the bed with possible long-term permanent nail procedure. Patient wants this done and today I infiltrated the left hallux 60 Milligan Xylocaine Marcaine mixture I removed the medial border exposed the area cleaned that up flushed it and applied sterile dressing. He'll reappoint 2 weeks for permanent procedure

## 2015-07-29 ENCOUNTER — Telehealth: Payer: Self-pay | Admitting: *Deleted

## 2015-07-29 NOTE — Telephone Encounter (Signed)
Called patient at [phone] (Home #) to check to see how they were doing from their ingrown toenail procedure that was performed on Wednesday, July 27, 2015. Pt stated, "Coming along as expected, but his entire foot is swelled". Pt has soaked toe with some relief. Pt's toe does feels sore and tender. I advised patient to elevate his foot for the swelling and ice it. Told the patient if the swelling does not go down by Monday, to call our office back.

## 2015-08-15 ENCOUNTER — Encounter: Payer: Self-pay | Admitting: Podiatry

## 2015-08-15 ENCOUNTER — Ambulatory Visit (INDEPENDENT_AMBULATORY_CARE_PROVIDER_SITE_OTHER): Payer: Commercial Managed Care - HMO | Admitting: Podiatry

## 2015-08-15 DIAGNOSIS — L600 Ingrowing nail: Secondary | ICD-10-CM | POA: Diagnosis not present

## 2015-08-15 DIAGNOSIS — L03031 Cellulitis of right toe: Secondary | ICD-10-CM

## 2015-08-15 DIAGNOSIS — L03011 Cellulitis of right finger: Secondary | ICD-10-CM

## 2015-08-15 MED ORDER — CEPHALEXIN 500 MG PO CAPS: 500.0000 mg | ORAL_CAPSULE | Freq: Two times a day (BID) | ORAL | Status: AC

## 2015-08-15 NOTE — Patient Instructions (Signed)

## 2015-08-16 ENCOUNTER — Telehealth: Payer: Self-pay | Admitting: *Deleted

## 2015-08-16 NOTE — Telephone Encounter (Signed)
Called patient at [phone] (Home #) to check to see how they were doing from their ingrown toenail procedure that was performed on Monday, August 15, 2015. Pt stated, "toe is sore and has had some bleeding". Pt is soaking their toe and putting neosporin with a band-aid on. I advised patient to take ibuprofen for any pain they were having and also to allow his toe to air out a little bit before putting a band-aid back on.

## 2015-08-16 NOTE — Telephone Encounter (Signed)
Pt states he has completed his first soak and the toe is bleeding.  I spoke with pt and informed the oozing was not unusual and to continue the soaks 2 times a day and cover with a neosporin bandaid after each soak, may decrease to soaking once daily after 2 weeks continue with the bandaid and neosporin, and allow to air dry as Dr. Paulla Dolly had suggested.

## 2015-08-16 NOTE — Progress Notes (Signed)
Subjective:     Patient ID: James Dorsey, male   DOB: 09-Jan-1934, 81 y.o.   MRN: [id number]  HPI patient states I want to get this ingrown toenail fixed permanently on my left big toe as it's been a problem   Review of Systems     Objective:   Physical Exam Neurovascular status intact muscle strength adequate range of motion within normal limits with patient noted to have an incurvated left hallux medial border that has improved with minimal redness and no active drainage noted    Assessment:     Ingrown toenail deformity left hallux with paronychia which is improving with mild proximal redness localized in nature    Plan:     Discussed correction of the ingrown and risk and patient wants procedure. I did place him on cephalexin 500 mg twice a day as precautionary measure and today I infiltrated the left big toe 60 mg Xylocaine Marcaine mixture removed proud flesh from the medial side exposed the base and applied phenol 3 applications 30 seconds followed by alcohol lavage and sterile dressing. Gave instructions on soaks and reappoint

## 2015-10-06 DIAGNOSIS — D3131 Benign neoplasm of right choroid: Secondary | ICD-10-CM | POA: Diagnosis not present

## 2015-10-06 DIAGNOSIS — H524 Presbyopia: Secondary | ICD-10-CM | POA: Diagnosis not present

## 2015-10-06 DIAGNOSIS — H26491 Other secondary cataract, right eye: Secondary | ICD-10-CM | POA: Diagnosis not present

## 2015-10-12 DIAGNOSIS — M545 Low back pain: Secondary | ICD-10-CM | POA: Diagnosis not present

## 2015-10-19 DIAGNOSIS — H26491 Other secondary cataract, right eye: Secondary | ICD-10-CM | POA: Diagnosis not present

## 2016-03-23 DIAGNOSIS — L600 Ingrowing nail: Secondary | ICD-10-CM | POA: Diagnosis not present

## 2016-06-21 DIAGNOSIS — R828 Abnormal findings on cytological and histological examination of urine: Secondary | ICD-10-CM | POA: Diagnosis not present

## 2016-06-21 DIAGNOSIS — D090 Carcinoma in situ of bladder: Secondary | ICD-10-CM | POA: Diagnosis not present

## 2016-06-28 DIAGNOSIS — N400 Enlarged prostate without lower urinary tract symptoms: Secondary | ICD-10-CM | POA: Diagnosis not present

## 2016-06-28 DIAGNOSIS — Z8551 Personal history of malignant neoplasm of bladder: Secondary | ICD-10-CM | POA: Diagnosis not present

## 2016-07-05 DIAGNOSIS — Z23 Encounter for immunization: Secondary | ICD-10-CM | POA: Diagnosis not present

## 2016-07-11 DIAGNOSIS — M79659 Pain in unspecified thigh: Secondary | ICD-10-CM | POA: Diagnosis not present

## 2016-07-13 DIAGNOSIS — D090 Carcinoma in situ of bladder: Secondary | ICD-10-CM | POA: Diagnosis not present

## 2016-07-26 DIAGNOSIS — M79659 Pain in unspecified thigh: Secondary | ICD-10-CM | POA: Diagnosis not present

## 2016-08-22 DIAGNOSIS — R350 Frequency of micturition: Secondary | ICD-10-CM | POA: Diagnosis not present

## 2016-08-22 DIAGNOSIS — M25551 Pain in right hip: Secondary | ICD-10-CM | POA: Diagnosis not present

## 2016-08-22 DIAGNOSIS — N390 Urinary tract infection, site not specified: Secondary | ICD-10-CM | POA: Diagnosis not present

## 2016-08-22 DIAGNOSIS — M791 Myalgia: Secondary | ICD-10-CM | POA: Diagnosis not present

## 2016-08-22 DIAGNOSIS — R319 Hematuria, unspecified: Secondary | ICD-10-CM | POA: Diagnosis not present

## 2016-08-22 DIAGNOSIS — M7061 Trochanteric bursitis, right hip: Secondary | ICD-10-CM | POA: Diagnosis not present

## 2016-09-01 DIAGNOSIS — M25551 Pain in right hip: Secondary | ICD-10-CM | POA: Diagnosis not present

## 2016-09-04 DIAGNOSIS — Z0000 Encounter for general adult medical examination without abnormal findings: Secondary | ICD-10-CM | POA: Diagnosis not present

## 2016-09-04 DIAGNOSIS — M25551 Pain in right hip: Secondary | ICD-10-CM | POA: Diagnosis not present

## 2016-09-04 DIAGNOSIS — E7800 Pure hypercholesterolemia, unspecified: Secondary | ICD-10-CM | POA: Diagnosis not present

## 2016-09-06 ENCOUNTER — Other Ambulatory Visit: Payer: Self-pay | Admitting: Family Medicine

## 2016-09-06 DIAGNOSIS — M25551 Pain in right hip: Secondary | ICD-10-CM

## 2016-09-10 ENCOUNTER — Ambulatory Visit
Admission: RE | Admit: 2016-09-10 | Discharge: 2016-09-10 | Disposition: A | Discharge status: Home / Self Care | Payer: Commercial Managed Care - HMO | Source: Ambulatory Visit | Attending: Family Medicine | Admitting: Family Medicine

## 2016-09-10 DIAGNOSIS — M1611 Unilateral primary osteoarthritis, right hip: Secondary | ICD-10-CM | POA: Diagnosis not present

## 2016-09-10 DIAGNOSIS — M25551 Pain in right hip: Secondary | ICD-10-CM

## 2016-09-13 ENCOUNTER — Other Ambulatory Visit: Payer: Commercial Managed Care - HMO

## 2016-09-14 DIAGNOSIS — M25551 Pain in right hip: Secondary | ICD-10-CM | POA: Diagnosis not present

## 2016-09-17 DIAGNOSIS — M25551 Pain in right hip: Secondary | ICD-10-CM | POA: Diagnosis not present

## 2016-09-18 DIAGNOSIS — M25551 Pain in right hip: Secondary | ICD-10-CM | POA: Diagnosis not present

## 2016-10-05 DIAGNOSIS — M545 Low back pain: Secondary | ICD-10-CM | POA: Diagnosis not present

## 2016-10-15 DIAGNOSIS — M48062 Spinal stenosis, lumbar region with neurogenic claudication: Secondary | ICD-10-CM | POA: Diagnosis not present

## 2016-10-22 DIAGNOSIS — M5416 Radiculopathy, lumbar region: Secondary | ICD-10-CM | POA: Diagnosis not present

## 2016-10-23 DIAGNOSIS — M5416 Radiculopathy, lumbar region: Secondary | ICD-10-CM | POA: Diagnosis not present

## 2016-10-29 DIAGNOSIS — M48062 Spinal stenosis, lumbar region with neurogenic claudication: Secondary | ICD-10-CM | POA: Diagnosis not present

## 2016-10-30 DIAGNOSIS — M5416 Radiculopathy, lumbar region: Secondary | ICD-10-CM | POA: Diagnosis not present

## 2016-11-01 DIAGNOSIS — M5416 Radiculopathy, lumbar region: Secondary | ICD-10-CM | POA: Diagnosis not present

## 2016-11-05 DIAGNOSIS — M5416 Radiculopathy, lumbar region: Secondary | ICD-10-CM | POA: Diagnosis not present

## 2016-11-06 DIAGNOSIS — M48062 Spinal stenosis, lumbar region with neurogenic claudication: Secondary | ICD-10-CM | POA: Diagnosis not present

## 2016-11-09 DIAGNOSIS — M5416 Radiculopathy, lumbar region: Secondary | ICD-10-CM | POA: Diagnosis not present

## 2016-11-12 DIAGNOSIS — M5416 Radiculopathy, lumbar region: Secondary | ICD-10-CM | POA: Diagnosis not present

## 2016-11-15 DIAGNOSIS — H26492 Other secondary cataract, left eye: Secondary | ICD-10-CM | POA: Diagnosis not present

## 2016-11-15 DIAGNOSIS — H04123 Dry eye syndrome of bilateral lacrimal glands: Secondary | ICD-10-CM | POA: Diagnosis not present

## 2016-11-15 DIAGNOSIS — H524 Presbyopia: Secondary | ICD-10-CM | POA: Diagnosis not present

## 2016-11-15 DIAGNOSIS — D3131 Benign neoplasm of right choroid: Secondary | ICD-10-CM | POA: Diagnosis not present

## 2016-11-16 DIAGNOSIS — M48062 Spinal stenosis, lumbar region with neurogenic claudication: Secondary | ICD-10-CM | POA: Diagnosis not present

## 2016-11-19 DIAGNOSIS — L989 Disorder of the skin and subcutaneous tissue, unspecified: Secondary | ICD-10-CM | POA: Diagnosis not present

## 2016-11-19 DIAGNOSIS — L821 Other seborrheic keratosis: Secondary | ICD-10-CM | POA: Diagnosis not present

## 2016-11-29 DIAGNOSIS — M48062 Spinal stenosis, lumbar region with neurogenic claudication: Secondary | ICD-10-CM | POA: Diagnosis not present

## 2016-12-14 DIAGNOSIS — M48062 Spinal stenosis, lumbar region with neurogenic claudication: Secondary | ICD-10-CM | POA: Diagnosis not present

## 2016-12-25 DIAGNOSIS — M48062 Spinal stenosis, lumbar region with neurogenic claudication: Secondary | ICD-10-CM | POA: Diagnosis not present

## 2017-01-14 DIAGNOSIS — M7061 Trochanteric bursitis, right hip: Secondary | ICD-10-CM | POA: Diagnosis not present

## 2017-01-14 DIAGNOSIS — M48062 Spinal stenosis, lumbar region with neurogenic claudication: Secondary | ICD-10-CM | POA: Diagnosis not present

## 2017-05-01 DIAGNOSIS — L820 Inflamed seborrheic keratosis: Secondary | ICD-10-CM | POA: Diagnosis not present

## 2017-05-01 DIAGNOSIS — L814 Other melanin hyperpigmentation: Secondary | ICD-10-CM | POA: Diagnosis not present

## 2017-05-01 DIAGNOSIS — D1801 Hemangioma of skin and subcutaneous tissue: Secondary | ICD-10-CM | POA: Diagnosis not present

## 2017-05-01 DIAGNOSIS — L821 Other seborrheic keratosis: Secondary | ICD-10-CM | POA: Diagnosis not present

## 2017-05-01 DIAGNOSIS — B078 Other viral warts: Secondary | ICD-10-CM | POA: Diagnosis not present

## 2017-07-04 DIAGNOSIS — Z23 Encounter for immunization: Secondary | ICD-10-CM | POA: Diagnosis not present

## 2017-08-22 DIAGNOSIS — Z8551 Personal history of malignant neoplasm of bladder: Secondary | ICD-10-CM | POA: Diagnosis not present

## 2017-08-22 DIAGNOSIS — R972 Elevated prostate specific antigen [PSA]: Secondary | ICD-10-CM | POA: Diagnosis not present

## 2017-11-04 DIAGNOSIS — J029 Acute pharyngitis, unspecified: Secondary | ICD-10-CM | POA: Diagnosis not present

## 2017-11-08 DIAGNOSIS — Z79899 Other long term (current) drug therapy: Secondary | ICD-10-CM | POA: Diagnosis not present

## 2017-11-08 DIAGNOSIS — Z131 Encounter for screening for diabetes mellitus: Secondary | ICD-10-CM | POA: Diagnosis not present

## 2017-11-08 DIAGNOSIS — E785 Hyperlipidemia, unspecified: Secondary | ICD-10-CM | POA: Diagnosis not present

## 2017-11-08 DIAGNOSIS — Z0000 Encounter for general adult medical examination without abnormal findings: Secondary | ICD-10-CM | POA: Diagnosis not present

## 2017-11-21 DIAGNOSIS — H524 Presbyopia: Secondary | ICD-10-CM | POA: Diagnosis not present

## 2017-11-21 DIAGNOSIS — D3131 Benign neoplasm of right choroid: Secondary | ICD-10-CM | POA: Diagnosis not present

## 2017-11-21 DIAGNOSIS — H04123 Dry eye syndrome of bilateral lacrimal glands: Secondary | ICD-10-CM | POA: Diagnosis not present

## 2017-11-21 DIAGNOSIS — H26492 Other secondary cataract, left eye: Secondary | ICD-10-CM | POA: Diagnosis not present

## 2017-11-26 DIAGNOSIS — R070 Pain in throat: Secondary | ICD-10-CM | POA: Diagnosis not present

## 2017-11-28 ENCOUNTER — Encounter (INDEPENDENT_AMBULATORY_CARE_PROVIDER_SITE_OTHER): Payer: Commercial Managed Care - HMO | Admitting: Ophthalmology

## 2018-04-23 IMAGING — MR MR HIP*R* W/O CM
4 of 5 series · 27 of 40 positions shown · non-contrast
Comparison: CT abdomen and pelvis 10/19/2011.

CLINICAL DATA: Right hip pain for 6 weeks.  No known injury.

EXAM:
MR OF THE RIGHT HIP WITHOUT CONTRAST
TECHNIQUE: Multiplanar, multisequence MR imaging was performed. No intravenous
contrast was administered.

[Series 8: STIR · coronal · right · 3.0mm · 1.25mm/px · 3 of 25 slices shown]
[im 5/25]
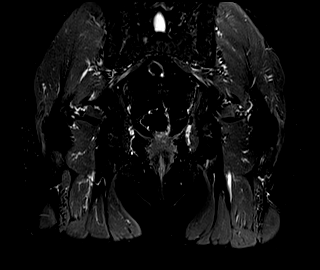
[im 15/25]
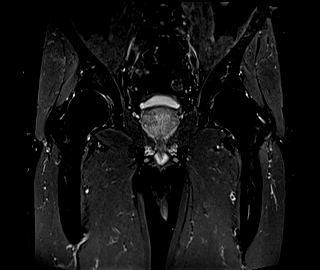
[im 25/25]
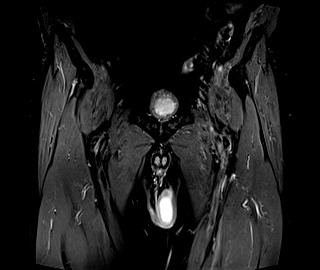

[Series 9: T1 · coronal · right · 3.0mm · 0.78mm/px · 7 of 25 slices shown (1 of 2)]
[im 1/25]
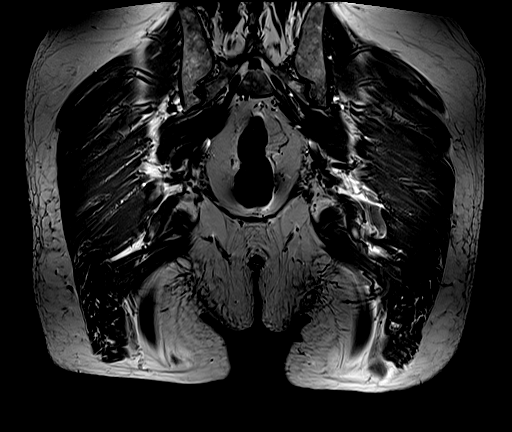
[im 5/25]
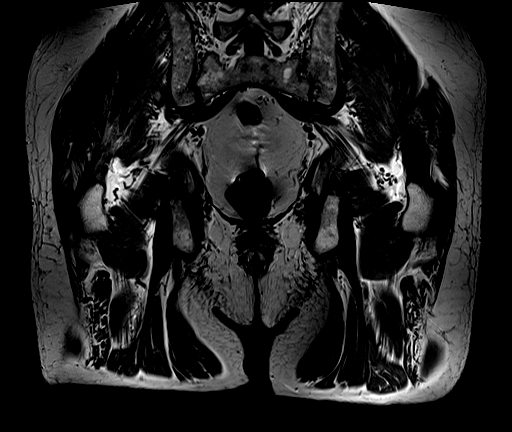
[im 9/25]
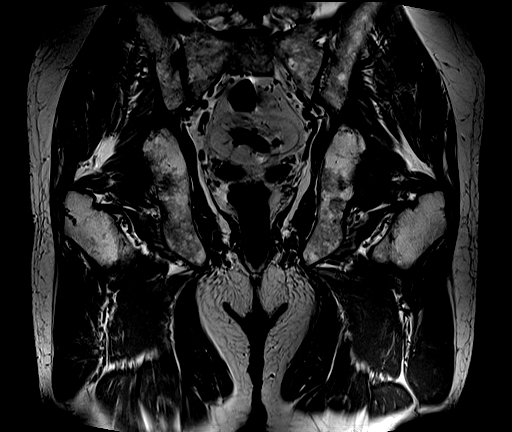
[im 13/25]
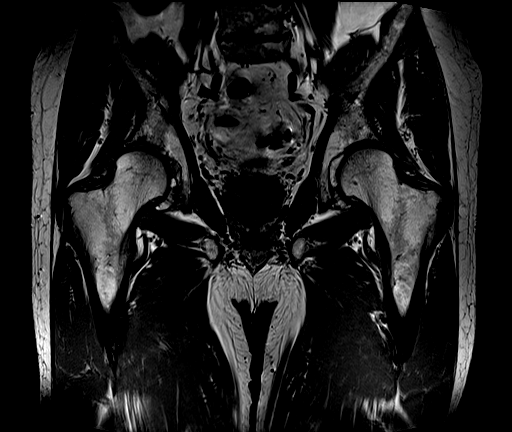
[im 17/25]
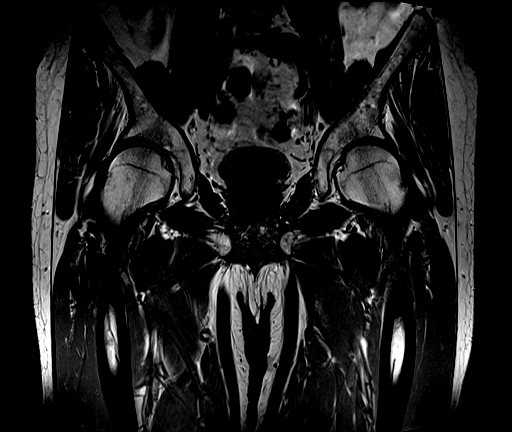
[im 21/25]
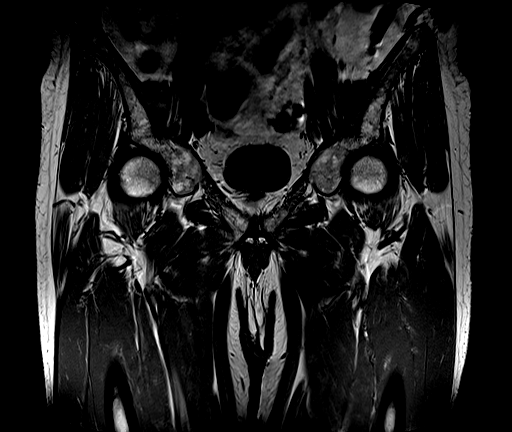
[im 25/25]
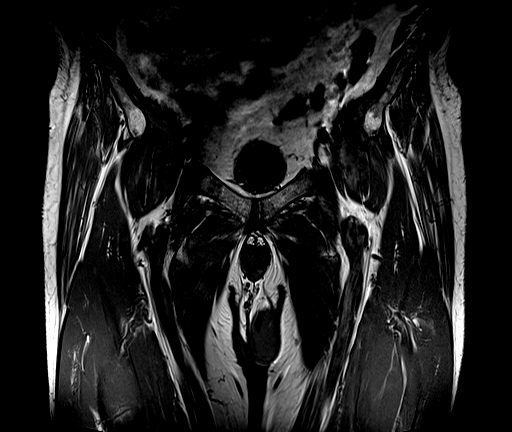

[Series 10: T1 · axial · right · 3.0mm · 0.78mm/px · z∈[+15,+117]mm · 8 of 32 slices shown (2 of 2)]
[im 1/32]
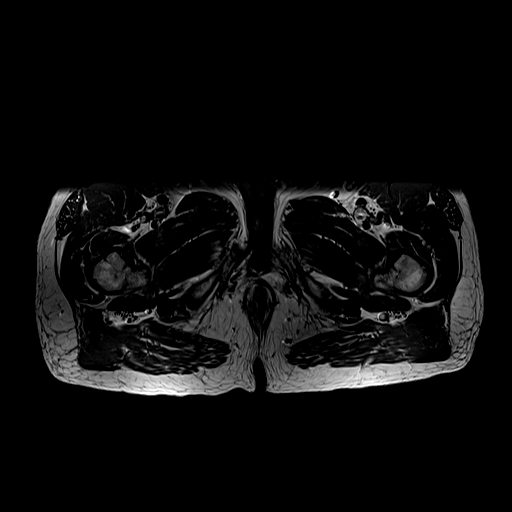
[im 4/32]
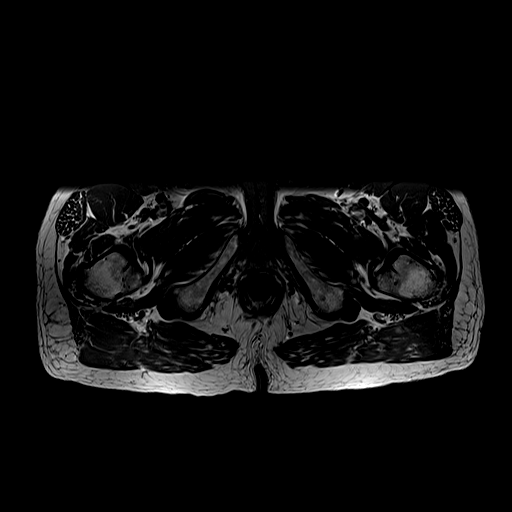
[im 8/32]
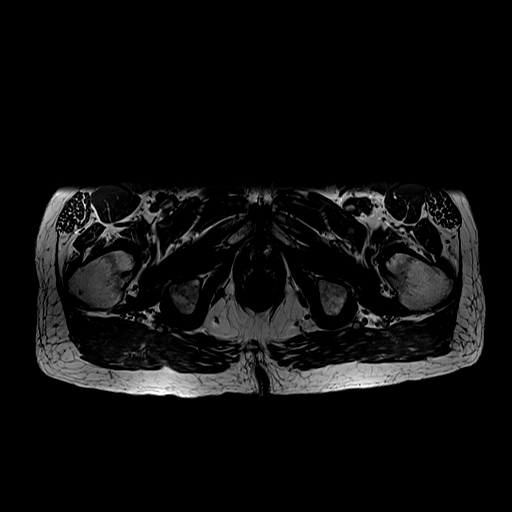
[im 12/32]
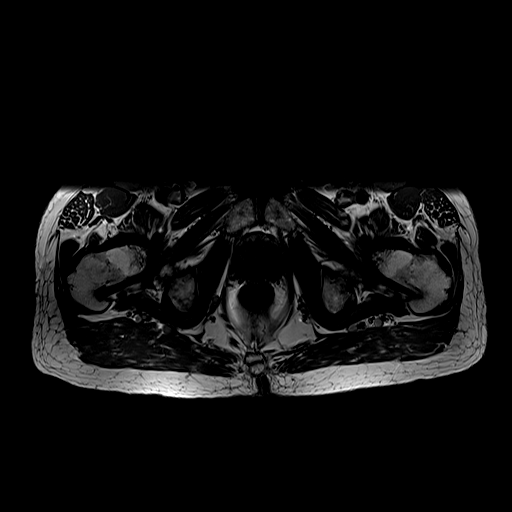
[im 16/32]
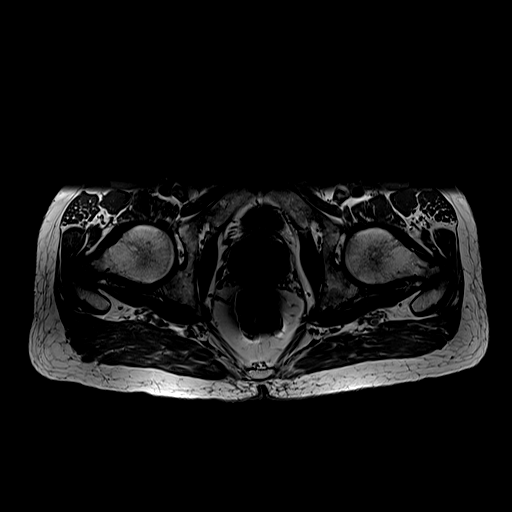
[im 20/32]
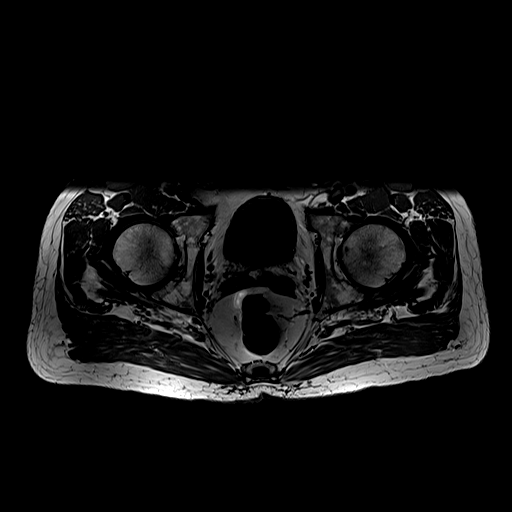
[im 24/32]
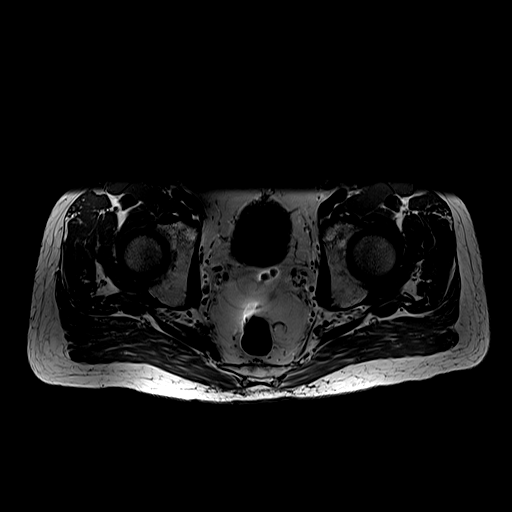
[im 28/32]
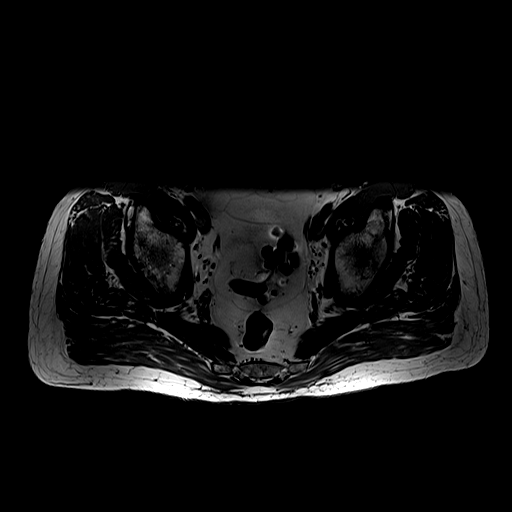

[Series 12: PD · sagittal · right · 3.0mm · 0.56mm/px · 9 of 33 slices shown]
[im 1/33]
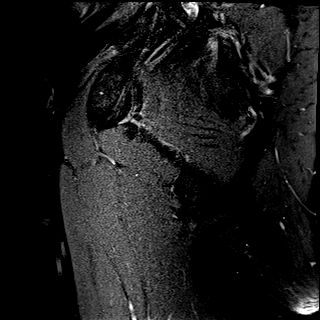
[im 5/33]
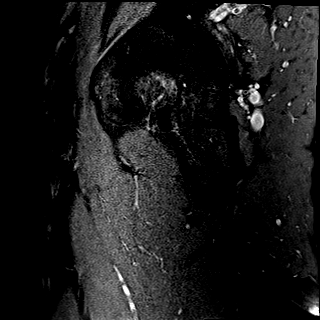
[im 9/33]
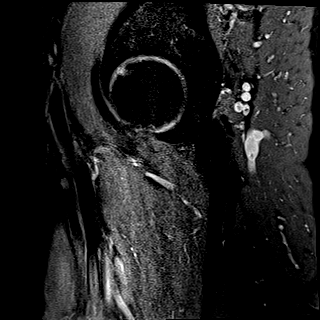
[im 13/33]
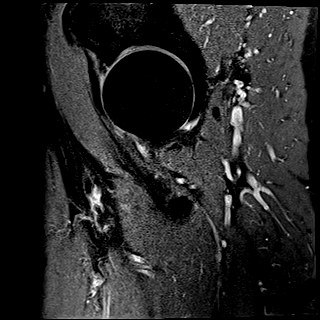
[im 17/33]
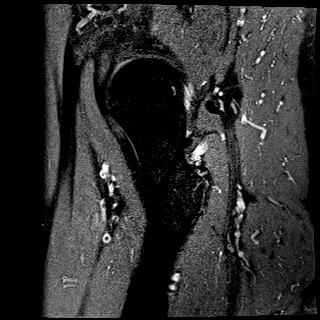
[im 21/33]
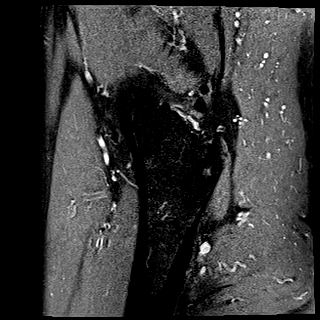
[im 25/33]
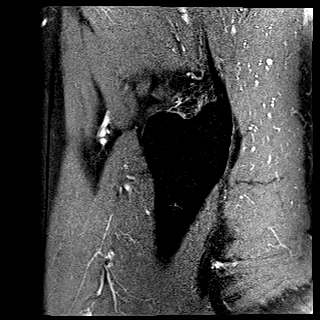
[im 29/33]
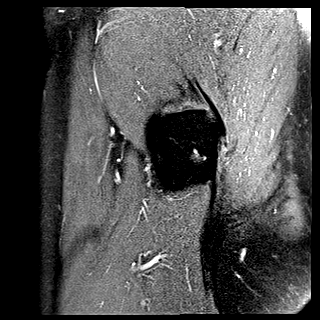
[im 33/33]
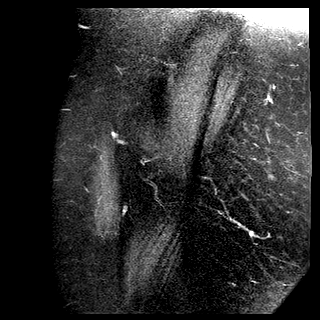

[27 of 40 positions shown; findings below may reference images not displayed]

FINDINGS: Bones: Marrow signal is normal in the femoral heads without fracture
or avascular necrosis. Tiny subchondral cyst in the periphery of the
roof of the acetabulum on the right is noted. Degenerative endplate
signal change is seen eccentric to the left at L5-S1. Bone marrow
signal is otherwise unremarkable.

Articular cartilage and labrum

Articular cartilage:  Demonstrates some thinning and irregularity.

Labrum: Degenerative tearing is seen in the anterior, superior
labrum.

Joint or bursal effusion

Joint effusion:  None.

Bursae:  Unremarkable.

Muscles and tendons

Muscles and tendons:  Intact and unremarkable.

Other findings

Miscellaneous: Sigmoid diverticulosis is noted. Otherwise negative.
IMPRESSION: Mild appearing right hip osteoarthritis with associated degenerative
tearing of the anterior, superior right acetabular labrum.

Partial visualization of degenerative disc disease lower lumbar
spine.

Sigmoid diverticulosis without diverticulitis.

## 2018-08-21 DIAGNOSIS — Z23 Encounter for immunization: Secondary | ICD-10-CM | POA: Diagnosis not present

## 2018-08-27 DIAGNOSIS — Z8551 Personal history of malignant neoplasm of bladder: Secondary | ICD-10-CM | POA: Diagnosis not present

## 2018-11-20 DIAGNOSIS — H04123 Dry eye syndrome of bilateral lacrimal glands: Secondary | ICD-10-CM | POA: Diagnosis not present

## 2018-11-20 DIAGNOSIS — H26492 Other secondary cataract, left eye: Secondary | ICD-10-CM | POA: Diagnosis not present

## 2018-11-20 DIAGNOSIS — D3131 Benign neoplasm of right choroid: Secondary | ICD-10-CM | POA: Diagnosis not present

## 2018-11-27 DIAGNOSIS — Z131 Encounter for screening for diabetes mellitus: Secondary | ICD-10-CM | POA: Diagnosis not present

## 2018-11-27 DIAGNOSIS — E785 Hyperlipidemia, unspecified: Secondary | ICD-10-CM | POA: Diagnosis not present

## 2018-11-27 DIAGNOSIS — Z79899 Other long term (current) drug therapy: Secondary | ICD-10-CM | POA: Diagnosis not present

## 2018-12-10 DIAGNOSIS — H5032 Intermittent alternating esotropia: Secondary | ICD-10-CM | POA: Diagnosis not present

## 2018-12-10 DIAGNOSIS — H4912 Fourth [trochlear] nerve palsy, left eye: Secondary | ICD-10-CM | POA: Diagnosis not present

## 2018-12-17 DIAGNOSIS — Z0000 Encounter for general adult medical examination without abnormal findings: Secondary | ICD-10-CM | POA: Diagnosis not present

## 2019-05-14 DIAGNOSIS — E785 Hyperlipidemia, unspecified: Secondary | ICD-10-CM | POA: Diagnosis not present

## 2019-05-14 DIAGNOSIS — Z8249 Family history of ischemic heart disease and other diseases of the circulatory system: Secondary | ICD-10-CM | POA: Diagnosis not present

## 2019-05-14 DIAGNOSIS — K5900 Constipation, unspecified: Secondary | ICD-10-CM | POA: Diagnosis not present

## 2019-05-14 DIAGNOSIS — N529 Male erectile dysfunction, unspecified: Secondary | ICD-10-CM | POA: Diagnosis not present

## 2019-05-14 DIAGNOSIS — R32 Unspecified urinary incontinence: Secondary | ICD-10-CM | POA: Diagnosis not present

## 2019-05-14 DIAGNOSIS — Z8551 Personal history of malignant neoplasm of bladder: Secondary | ICD-10-CM | POA: Diagnosis not present

## 2019-05-14 DIAGNOSIS — Z7982 Long term (current) use of aspirin: Secondary | ICD-10-CM | POA: Diagnosis not present

## 2019-05-14 DIAGNOSIS — Z809 Family history of malignant neoplasm, unspecified: Secondary | ICD-10-CM | POA: Diagnosis not present

## 2019-05-14 DIAGNOSIS — Z87891 Personal history of nicotine dependence: Secondary | ICD-10-CM | POA: Diagnosis not present

## 2019-05-28 DIAGNOSIS — H524 Presbyopia: Secondary | ICD-10-CM | POA: Diagnosis not present

## 2019-05-28 DIAGNOSIS — H04123 Dry eye syndrome of bilateral lacrimal glands: Secondary | ICD-10-CM | POA: Diagnosis not present

## 2019-05-28 DIAGNOSIS — D3131 Benign neoplasm of right choroid: Secondary | ICD-10-CM | POA: Diagnosis not present

## 2019-05-28 DIAGNOSIS — H532 Diplopia: Secondary | ICD-10-CM | POA: Diagnosis not present

## 2019-05-28 DIAGNOSIS — H26492 Other secondary cataract, left eye: Secondary | ICD-10-CM | POA: Diagnosis not present

## 2019-07-16 DIAGNOSIS — Z23 Encounter for immunization: Secondary | ICD-10-CM | POA: Diagnosis not present

## 2019-09-23 DIAGNOSIS — R69 Illness, unspecified: Secondary | ICD-10-CM | POA: Diagnosis not present

## 2019-10-20 ENCOUNTER — Ambulatory Visit: Payer: Medicare Other | Attending: Internal Medicine

## 2019-10-20 DIAGNOSIS — Z23 Encounter for immunization: Secondary | ICD-10-CM | POA: Insufficient documentation

## 2019-10-20 NOTE — Progress Notes (Signed)
   Covid-19 Vaccination Clinic  Name:  James Dorsey    MRN: [id number] DOB: [birth year]  10/20/2019  [name] was observed post Covid-19 immunization for 15 minutes without incidence. He was provided with Vaccine Information Sheet and instruction to access the V-Safe system.   [name] was instructed to call 911 with any severe reactions post vaccine: Marland Kitchen Difficulty breathing  . Swelling of your face and throat  . A fast heartbeat  . A bad rash all over your body  . Dizziness and weakness    Immunizations Administered    Name Date Dose VIS Date Route   Pfizer COVID-19 Vaccine 10/20/2019  8:38 AM 0.3 mL 09/18/2019 Intramuscular   Manufacturer: [employer]   Lot: [id number]   [place]: [id number]

## 2019-11-09 ENCOUNTER — Ambulatory Visit: Payer: Medicare HMO | Attending: Internal Medicine

## 2019-11-09 DIAGNOSIS — Z23 Encounter for immunization: Secondary | ICD-10-CM | POA: Insufficient documentation

## 2019-11-09 NOTE — Progress Notes (Signed)
   Covid-19 Vaccination Clinic  Name:  James Dorsey    MRN: [id number] DOB: March 10, 1934  11/09/2019  [name] was observed post Covid-19 immunization for 15 minutes without incidence. He was provided with Vaccine Information Sheet and instruction to access the V-Safe system.   [name] was instructed to call 911 with any severe reactions post vaccine: Marland Kitchen Difficulty breathing  . Swelling of your face and throat  . A fast heartbeat  . A bad rash all over your body  . Dizziness and weakness    Immunizations Administered    Name Date Dose VIS Date Route   Pfizer COVID-19 Vaccine 11/09/2019  8:26 AM 0.3 mL 09/18/2019 Intramuscular   Manufacturer: [place]   Lot: [id number]   [place]: [id number]

## 2020-02-18 DIAGNOSIS — Z0000 Encounter for general adult medical examination without abnormal findings: Secondary | ICD-10-CM | POA: Diagnosis not present

## 2020-02-18 DIAGNOSIS — E785 Hyperlipidemia, unspecified: Secondary | ICD-10-CM | POA: Diagnosis not present

## 2020-02-18 DIAGNOSIS — Z79899 Other long term (current) drug therapy: Secondary | ICD-10-CM | POA: Diagnosis not present

## 2020-03-24 DIAGNOSIS — R69 Illness, unspecified: Secondary | ICD-10-CM | POA: Diagnosis not present

## 2020-05-09 DIAGNOSIS — Z8551 Personal history of malignant neoplasm of bladder: Secondary | ICD-10-CM | POA: Diagnosis not present

## 2020-05-09 DIAGNOSIS — N400 Enlarged prostate without lower urinary tract symptoms: Secondary | ICD-10-CM | POA: Diagnosis not present

## 2020-05-30 DIAGNOSIS — H532 Diplopia: Secondary | ICD-10-CM | POA: Diagnosis not present

## 2020-05-30 DIAGNOSIS — H04123 Dry eye syndrome of bilateral lacrimal glands: Secondary | ICD-10-CM | POA: Diagnosis not present

## 2020-05-30 DIAGNOSIS — D3131 Benign neoplasm of right choroid: Secondary | ICD-10-CM | POA: Diagnosis not present

## 2020-05-30 DIAGNOSIS — Z961 Presence of intraocular lens: Secondary | ICD-10-CM | POA: Diagnosis not present

## 2020-06-07 DIAGNOSIS — N183 Chronic kidney disease, stage 3 unspecified: Secondary | ICD-10-CM | POA: Diagnosis not present

## 2020-06-07 DIAGNOSIS — E785 Hyperlipidemia, unspecified: Secondary | ICD-10-CM | POA: Diagnosis not present

## 2020-06-07 DIAGNOSIS — I4891 Unspecified atrial fibrillation: Secondary | ICD-10-CM | POA: Diagnosis not present

## 2020-06-14 ENCOUNTER — Ambulatory Visit: Payer: Self-pay | Attending: Internal Medicine

## 2020-06-14 DIAGNOSIS — Z23 Encounter for immunization: Secondary | ICD-10-CM

## 2020-06-14 NOTE — Progress Notes (Signed)
   Covid-19 Vaccination Clinic  Name:  James Dorsey    MRN: [id number] DOB: 1934-01-08  06/14/2020  James Dorsey was observed post Covid-19 immunization for 15 minutes without incident. He was provided with Vaccine Information Sheet and instruction to access the V-Safe system. Vaccine administered by Gerome Apley, [place] pharmacy student.  James Dorsey was instructed to call 911 with any severe reactions post vaccine: Marland Kitchen Difficulty breathing  . Swelling of face and throat  . A fast heartbeat  . A bad rash all over body  . Dizziness and weakness

## 2020-07-27 DIAGNOSIS — Z23 Encounter for immunization: Secondary | ICD-10-CM | POA: Diagnosis not present

## 2020-08-03 DIAGNOSIS — R69 Illness, unspecified: Secondary | ICD-10-CM | POA: Diagnosis not present

## 2020-08-04 DIAGNOSIS — I4891 Unspecified atrial fibrillation: Secondary | ICD-10-CM | POA: Diagnosis not present

## 2020-08-04 DIAGNOSIS — N183 Chronic kidney disease, stage 3 unspecified: Secondary | ICD-10-CM | POA: Diagnosis not present

## 2020-08-04 DIAGNOSIS — E785 Hyperlipidemia, unspecified: Secondary | ICD-10-CM | POA: Diagnosis not present

## 2020-08-21 DIAGNOSIS — R32 Unspecified urinary incontinence: Secondary | ICD-10-CM | POA: Diagnosis not present

## 2020-08-21 DIAGNOSIS — Z8551 Personal history of malignant neoplasm of bladder: Secondary | ICD-10-CM | POA: Diagnosis not present

## 2020-08-21 DIAGNOSIS — N529 Male erectile dysfunction, unspecified: Secondary | ICD-10-CM | POA: Diagnosis not present

## 2020-08-21 DIAGNOSIS — E785 Hyperlipidemia, unspecified: Secondary | ICD-10-CM | POA: Diagnosis not present

## 2020-09-14 DIAGNOSIS — I4891 Unspecified atrial fibrillation: Secondary | ICD-10-CM | POA: Diagnosis not present

## 2020-09-14 DIAGNOSIS — N183 Chronic kidney disease, stage 3 unspecified: Secondary | ICD-10-CM | POA: Diagnosis not present

## 2020-09-14 DIAGNOSIS — I2721 Secondary pulmonary arterial hypertension: Secondary | ICD-10-CM | POA: Diagnosis not present

## 2020-09-14 DIAGNOSIS — E785 Hyperlipidemia, unspecified: Secondary | ICD-10-CM | POA: Diagnosis not present

## 2020-12-25 DIAGNOSIS — E785 Hyperlipidemia, unspecified: Secondary | ICD-10-CM | POA: Diagnosis not present

## 2020-12-25 DIAGNOSIS — N183 Chronic kidney disease, stage 3 unspecified: Secondary | ICD-10-CM | POA: Diagnosis not present

## 2020-12-25 DIAGNOSIS — I4891 Unspecified atrial fibrillation: Secondary | ICD-10-CM | POA: Diagnosis not present

## 2021-02-17 DIAGNOSIS — Z131 Encounter for screening for diabetes mellitus: Secondary | ICD-10-CM | POA: Diagnosis not present

## 2021-02-17 DIAGNOSIS — Z0000 Encounter for general adult medical examination without abnormal findings: Secondary | ICD-10-CM | POA: Diagnosis not present

## 2021-02-17 DIAGNOSIS — Z79899 Other long term (current) drug therapy: Secondary | ICD-10-CM | POA: Diagnosis not present

## 2021-02-17 DIAGNOSIS — E785 Hyperlipidemia, unspecified: Secondary | ICD-10-CM | POA: Diagnosis not present

## 2021-02-27 DIAGNOSIS — L570 Actinic keratosis: Secondary | ICD-10-CM | POA: Diagnosis not present

## 2021-02-27 DIAGNOSIS — M79641 Pain in right hand: Secondary | ICD-10-CM | POA: Diagnosis not present

## 2021-02-27 DIAGNOSIS — L739 Follicular disorder, unspecified: Secondary | ICD-10-CM | POA: Diagnosis not present

## 2021-02-27 DIAGNOSIS — D1801 Hemangioma of skin and subcutaneous tissue: Secondary | ICD-10-CM | POA: Diagnosis not present

## 2021-02-27 DIAGNOSIS — L578 Other skin changes due to chronic exposure to nonionizing radiation: Secondary | ICD-10-CM | POA: Diagnosis not present

## 2021-02-27 DIAGNOSIS — D485 Neoplasm of uncertain behavior of skin: Secondary | ICD-10-CM | POA: Diagnosis not present

## 2021-02-27 DIAGNOSIS — L814 Other melanin hyperpigmentation: Secondary | ICD-10-CM | POA: Diagnosis not present

## 2021-02-27 DIAGNOSIS — L821 Other seborrheic keratosis: Secondary | ICD-10-CM | POA: Diagnosis not present

## 2021-02-27 DIAGNOSIS — C44319 Basal cell carcinoma of skin of other parts of face: Secondary | ICD-10-CM | POA: Diagnosis not present

## 2021-03-10 DIAGNOSIS — C44319 Basal cell carcinoma of skin of other parts of face: Secondary | ICD-10-CM | POA: Diagnosis not present

## 2021-03-16 DIAGNOSIS — N183 Chronic kidney disease, stage 3 unspecified: Secondary | ICD-10-CM | POA: Diagnosis not present

## 2021-03-16 DIAGNOSIS — E785 Hyperlipidemia, unspecified: Secondary | ICD-10-CM | POA: Diagnosis not present

## 2021-03-16 DIAGNOSIS — I4891 Unspecified atrial fibrillation: Secondary | ICD-10-CM | POA: Diagnosis not present

## 2021-05-08 DIAGNOSIS — Z8551 Personal history of malignant neoplasm of bladder: Secondary | ICD-10-CM | POA: Diagnosis not present

## 2021-06-08 DIAGNOSIS — H04123 Dry eye syndrome of bilateral lacrimal glands: Secondary | ICD-10-CM | POA: Diagnosis not present

## 2021-06-08 DIAGNOSIS — D3131 Benign neoplasm of right choroid: Secondary | ICD-10-CM | POA: Diagnosis not present

## 2021-06-21 DIAGNOSIS — Z0100 Encounter for examination of eyes and vision without abnormal findings: Secondary | ICD-10-CM | POA: Diagnosis not present

## 2021-07-12 DIAGNOSIS — Z23 Encounter for immunization: Secondary | ICD-10-CM | POA: Diagnosis not present

## 2021-07-20 DIAGNOSIS — Z20822 Contact with and (suspected) exposure to covid-19: Secondary | ICD-10-CM | POA: Diagnosis not present

## 2021-07-20 DIAGNOSIS — U071 COVID-19: Secondary | ICD-10-CM | POA: Diagnosis not present

## 2021-11-06 ENCOUNTER — Ambulatory Visit: Payer: Medicare HMO | Attending: Internal Medicine

## 2021-11-06 ENCOUNTER — Other Ambulatory Visit (HOSPITAL_BASED_OUTPATIENT_CLINIC_OR_DEPARTMENT_OTHER): Payer: Self-pay

## 2021-11-06 DIAGNOSIS — Z23 Encounter for immunization: Secondary | ICD-10-CM

## 2021-11-06 MED ORDER — PFIZER COVID-19 VAC BIVALENT 30 MCG/0.3ML IM SUSP
INTRAMUSCULAR | 0 refills | Status: AC
  Filled 2021-11-06: qty 0.3, 1d supply, fill #0

## 2021-11-06 NOTE — Progress Notes (Signed)
° °  Covid-19 Vaccination Clinic  Name:  James Dorsey    MRN: [id number] DOB: 31-Aug-1934  11/06/2021  [name] was observed post Covid-19 immunization for 15 minutes without incident. He was provided with Vaccine Information Sheet and instruction to access the V-Safe system.   [name] was instructed to call 911 with any severe reactions post vaccine: Difficulty breathing  Swelling of face and throat  A fast heartbeat  A bad rash all over body  Dizziness and weakness   Immunizations Administered     Name Date Dose VIS Date Route   Pfizer Covid-19 Vaccine Bivalent Booster 11/06/2021  2:08 PM 0.3 mL 06/07/2021 Intramuscular   Manufacturer: [place]   Lot: [id number]   [place]: [phone]

## 2021-12-18 DIAGNOSIS — M7702 Medial epicondylitis, left elbow: Secondary | ICD-10-CM | POA: Diagnosis not present

## 2022-01-08 DIAGNOSIS — D225 Melanocytic nevi of trunk: Secondary | ICD-10-CM | POA: Diagnosis not present

## 2022-01-08 DIAGNOSIS — L821 Other seborrheic keratosis: Secondary | ICD-10-CM | POA: Diagnosis not present

## 2022-01-08 DIAGNOSIS — L820 Inflamed seborrheic keratosis: Secondary | ICD-10-CM | POA: Diagnosis not present

## 2022-01-08 DIAGNOSIS — Z85828 Personal history of other malignant neoplasm of skin: Secondary | ICD-10-CM | POA: Diagnosis not present

## 2022-01-08 DIAGNOSIS — D1801 Hemangioma of skin and subcutaneous tissue: Secondary | ICD-10-CM | POA: Diagnosis not present

## 2022-01-08 DIAGNOSIS — L814 Other melanin hyperpigmentation: Secondary | ICD-10-CM | POA: Diagnosis not present

## 2022-01-08 DIAGNOSIS — L578 Other skin changes due to chronic exposure to nonionizing radiation: Secondary | ICD-10-CM | POA: Diagnosis not present

## 2022-01-31 DIAGNOSIS — E785 Hyperlipidemia, unspecified: Secondary | ICD-10-CM | POA: Diagnosis not present

## 2022-01-31 DIAGNOSIS — Z79899 Other long term (current) drug therapy: Secondary | ICD-10-CM | POA: Diagnosis not present

## 2022-03-14 DIAGNOSIS — Z131 Encounter for screening for diabetes mellitus: Secondary | ICD-10-CM | POA: Diagnosis not present

## 2022-03-14 DIAGNOSIS — Z0000 Encounter for general adult medical examination without abnormal findings: Secondary | ICD-10-CM | POA: Diagnosis not present

## 2022-03-14 DIAGNOSIS — Z79899 Other long term (current) drug therapy: Secondary | ICD-10-CM | POA: Diagnosis not present

## 2022-03-14 DIAGNOSIS — E785 Hyperlipidemia, unspecified: Secondary | ICD-10-CM | POA: Diagnosis not present

## 2022-06-08 DIAGNOSIS — Z8551 Personal history of malignant neoplasm of bladder: Secondary | ICD-10-CM | POA: Diagnosis not present

## 2022-06-08 DIAGNOSIS — N400 Enlarged prostate without lower urinary tract symptoms: Secondary | ICD-10-CM | POA: Diagnosis not present

## 2022-06-28 DIAGNOSIS — Z961 Presence of intraocular lens: Secondary | ICD-10-CM | POA: Diagnosis not present

## 2022-06-28 DIAGNOSIS — D3131 Benign neoplasm of right choroid: Secondary | ICD-10-CM | POA: Diagnosis not present

## 2022-06-28 DIAGNOSIS — H04123 Dry eye syndrome of bilateral lacrimal glands: Secondary | ICD-10-CM | POA: Diagnosis not present

## 2022-06-28 DIAGNOSIS — H532 Diplopia: Secondary | ICD-10-CM | POA: Diagnosis not present

## 2022-07-11 DIAGNOSIS — M25522 Pain in left elbow: Secondary | ICD-10-CM | POA: Diagnosis not present

## 2022-08-13 DIAGNOSIS — M71122 Other infective bursitis, left elbow: Secondary | ICD-10-CM | POA: Diagnosis not present

## 2022-08-29 DIAGNOSIS — Z23 Encounter for immunization: Secondary | ICD-10-CM | POA: Diagnosis not present

## 2022-09-05 DIAGNOSIS — Z23 Encounter for immunization: Secondary | ICD-10-CM | POA: Diagnosis not present

## 2023-01-16 DIAGNOSIS — M7702 Medial epicondylitis, left elbow: Secondary | ICD-10-CM | POA: Diagnosis not present

## 2023-01-17 DIAGNOSIS — R3912 Poor urinary stream: Secondary | ICD-10-CM | POA: Diagnosis not present

## 2023-01-17 DIAGNOSIS — R350 Frequency of micturition: Secondary | ICD-10-CM | POA: Diagnosis not present

## 2023-01-17 DIAGNOSIS — N401 Enlarged prostate with lower urinary tract symptoms: Secondary | ICD-10-CM | POA: Diagnosis not present

## 2023-02-07 ENCOUNTER — Encounter: Payer: Self-pay | Admitting: Podiatry

## 2023-02-07 ENCOUNTER — Ambulatory Visit: Payer: Medicare HMO | Admitting: Podiatry

## 2023-02-07 DIAGNOSIS — L600 Ingrowing nail: Secondary | ICD-10-CM | POA: Diagnosis not present

## 2023-02-07 NOTE — Patient Instructions (Signed)

## 2023-02-07 NOTE — Progress Notes (Signed)
Subjective:   Patient ID: James Dorsey, male   DOB: 89 y.o.   MRN: [id number]   HPI Patient presents with ingrown toenail right big toe that is been very tender going on around 7 months getting worse recently.  Has had the left one fixed in the past and is known this 1 needs to be taken care of.  Patient does not smoke likes to be active   Review of Systems  All other systems reviewed and are negative.       Objective:  Physical Exam Vitals and nursing note reviewed.  Constitutional:      Appearance: He is well-developed.  Pulmonary:     Effort: Pulmonary effort is normal.  Musculoskeletal:        General: Normal range of motion.  Skin:    General: Skin is warm.  Neurological:     Mental Status: He is alert.     Neurovascular status found to be intact muscle strength found to be adequate range of motion within normal limits.  Patient is found to have incurvated lateral border right hallux painful when pressed localized no proximal edema erythema drainage noted associated with it just painful with structural deformity.     Assessment:  Chronic ingrown toenail deformity right hallux lateral border with patient with excellent digital perfusion good health      Plan:  H&P recommended correction explained procedure risk and patient wants to have this fixed.  At this time I allowed him to read to sign consent form understanding risk I infiltrated the right big toe 60 mg like Marcaine mixture sterile prep done using sterile instrumentation remove the lateral border exposed matrix applied phenol 3 applications 30 seconds followed by alcohol lavage sterile dressing gave instructions on soaks wear dressing 24 hours take it off earlier if throbbing were to occur

## 2023-02-28 DIAGNOSIS — R3912 Poor urinary stream: Secondary | ICD-10-CM | POA: Diagnosis not present

## 2023-02-28 DIAGNOSIS — N401 Enlarged prostate with lower urinary tract symptoms: Secondary | ICD-10-CM | POA: Diagnosis not present

## 2023-02-28 DIAGNOSIS — Z8551 Personal history of malignant neoplasm of bladder: Secondary | ICD-10-CM | POA: Diagnosis not present

## 2023-03-21 DIAGNOSIS — Z23 Encounter for immunization: Secondary | ICD-10-CM | POA: Diagnosis not present

## 2023-03-21 DIAGNOSIS — Z0000 Encounter for general adult medical examination without abnormal findings: Secondary | ICD-10-CM | POA: Diagnosis not present

## 2023-03-21 DIAGNOSIS — Z79899 Other long term (current) drug therapy: Secondary | ICD-10-CM | POA: Diagnosis not present

## 2023-03-21 DIAGNOSIS — E785 Hyperlipidemia, unspecified: Secondary | ICD-10-CM | POA: Diagnosis not present

## 2023-03-21 DIAGNOSIS — Z131 Encounter for screening for diabetes mellitus: Secondary | ICD-10-CM | POA: Diagnosis not present

## 2023-04-29 DIAGNOSIS — J019 Acute sinusitis, unspecified: Secondary | ICD-10-CM | POA: Diagnosis not present

## 2023-05-20 DIAGNOSIS — M5451 Vertebrogenic low back pain: Secondary | ICD-10-CM | POA: Diagnosis not present

## 2023-05-28 DIAGNOSIS — H52223 Regular astigmatism, bilateral: Secondary | ICD-10-CM | POA: Diagnosis not present

## 2023-05-28 DIAGNOSIS — D3131 Benign neoplasm of right choroid: Secondary | ICD-10-CM | POA: Diagnosis not present

## 2023-05-28 DIAGNOSIS — H532 Diplopia: Secondary | ICD-10-CM | POA: Diagnosis not present

## 2023-05-28 DIAGNOSIS — H524 Presbyopia: Secondary | ICD-10-CM | POA: Diagnosis not present

## 2023-05-28 DIAGNOSIS — Z961 Presence of intraocular lens: Secondary | ICD-10-CM | POA: Diagnosis not present

## 2023-06-25 DIAGNOSIS — Z0101 Encounter for examination of eyes and vision with abnormal findings: Secondary | ICD-10-CM | POA: Diagnosis not present

## 2023-06-26 DIAGNOSIS — N401 Enlarged prostate with lower urinary tract symptoms: Secondary | ICD-10-CM | POA: Diagnosis not present

## 2023-06-26 DIAGNOSIS — Z8551 Personal history of malignant neoplasm of bladder: Secondary | ICD-10-CM | POA: Diagnosis not present

## 2023-06-26 DIAGNOSIS — R350 Frequency of micturition: Secondary | ICD-10-CM | POA: Diagnosis not present

## 2023-07-24 DIAGNOSIS — M7702 Medial epicondylitis, left elbow: Secondary | ICD-10-CM | POA: Diagnosis not present

## 2023-07-24 DIAGNOSIS — M7022 Olecranon bursitis, left elbow: Secondary | ICD-10-CM | POA: Diagnosis not present

## 2024-04-06 DIAGNOSIS — Z6826 Body mass index (BMI) 26.0-26.9, adult: Secondary | ICD-10-CM | POA: Diagnosis not present

## 2024-04-06 DIAGNOSIS — Z0000 Encounter for general adult medical examination without abnormal findings: Secondary | ICD-10-CM | POA: Diagnosis not present

## 2024-04-06 DIAGNOSIS — Z131 Encounter for screening for diabetes mellitus: Secondary | ICD-10-CM | POA: Diagnosis not present

## 2024-04-06 DIAGNOSIS — Z79899 Other long term (current) drug therapy: Secondary | ICD-10-CM | POA: Diagnosis not present

## 2024-04-06 DIAGNOSIS — E785 Hyperlipidemia, unspecified: Secondary | ICD-10-CM | POA: Diagnosis not present

## 2024-04-14 DIAGNOSIS — R399 Unspecified symptoms and signs involving the genitourinary system: Secondary | ICD-10-CM | POA: Diagnosis not present

## 2024-04-14 DIAGNOSIS — B359 Dermatophytosis, unspecified: Secondary | ICD-10-CM | POA: Diagnosis not present

## 2024-05-13 DIAGNOSIS — R350 Frequency of micturition: Secondary | ICD-10-CM | POA: Diagnosis not present

## 2024-05-13 DIAGNOSIS — N401 Enlarged prostate with lower urinary tract symptoms: Secondary | ICD-10-CM | POA: Diagnosis not present

## 2024-05-26 DIAGNOSIS — L820 Inflamed seborrheic keratosis: Secondary | ICD-10-CM | POA: Diagnosis not present
